# Patient Record
Sex: Female | Born: 1980 | Race: Black or African American | Hispanic: No | Marital: Married | Smoking: Never smoker
Health system: Southern US, Community
[De-identification: ages and names within clinical notes are randomized; demographics above are authoritative.]

## PROBLEM LIST (undated history)

## (undated) DIAGNOSIS — E669 Obesity, unspecified: Secondary | ICD-10-CM

## (undated) DIAGNOSIS — I10 Essential (primary) hypertension: Secondary | ICD-10-CM

## (undated) HISTORY — PX: WISDOM TOOTH EXTRACTION: SHX21

---

## 1998-10-22 ENCOUNTER — Emergency Department (HOSPITAL_COMMUNITY): Admission: EM | Admit: 1998-10-22 | Discharge: 1998-10-22 | Payer: Self-pay | Admitting: Emergency Medicine

## 1998-10-22 ENCOUNTER — Encounter: Payer: Self-pay | Admitting: Emergency Medicine

## 2000-06-01 ENCOUNTER — Emergency Department (HOSPITAL_COMMUNITY): Admission: EM | Admit: 2000-06-01 | Discharge: 2000-06-01 | Payer: Self-pay | Admitting: Emergency Medicine

## 2001-09-11 ENCOUNTER — Emergency Department (HOSPITAL_COMMUNITY): Admission: EM | Admit: 2001-09-11 | Discharge: 2001-09-11 | Payer: Self-pay | Admitting: Emergency Medicine

## 2001-09-11 ENCOUNTER — Encounter: Payer: Self-pay | Admitting: Emergency Medicine

## 2003-07-22 ENCOUNTER — Emergency Department (HOSPITAL_COMMUNITY): Admission: EM | Admit: 2003-07-22 | Discharge: 2003-07-22 | Payer: Self-pay | Admitting: Emergency Medicine

## 2003-07-22 ENCOUNTER — Encounter: Payer: Self-pay | Admitting: Emergency Medicine

## 2004-05-19 ENCOUNTER — Emergency Department (HOSPITAL_COMMUNITY): Admission: EM | Admit: 2004-05-19 | Discharge: 2004-05-19 | Payer: Self-pay | Admitting: Emergency Medicine

## 2005-08-20 ENCOUNTER — Emergency Department (HOSPITAL_COMMUNITY): Admission: EM | Admit: 2005-08-20 | Discharge: 2005-08-20 | Payer: Self-pay | Admitting: Emergency Medicine

## 2006-12-14 ENCOUNTER — Inpatient Hospital Stay (HOSPITAL_COMMUNITY): Admission: AD | Admit: 2006-12-14 | Discharge: 2006-12-16 | Payer: Self-pay | Admitting: Psychiatry

## 2006-12-14 ENCOUNTER — Emergency Department (HOSPITAL_COMMUNITY): Admission: EM | Admit: 2006-12-14 | Discharge: 2006-12-15 | Payer: Self-pay | Admitting: Emergency Medicine

## 2006-12-14 ENCOUNTER — Ambulatory Visit: Payer: Self-pay | Admitting: Psychiatry

## 2009-05-15 ENCOUNTER — Emergency Department (HOSPITAL_COMMUNITY): Admission: EM | Admit: 2009-05-15 | Discharge: 2009-05-15 | Payer: Self-pay | Admitting: Emergency Medicine

## 2011-04-30 NOTE — Discharge Summary (Signed)
Cassandra Mayer, Cassandra Mayer NO.:  [id number]   MEDICAL RECORD NO.:  [id number]          PATIENT TYPE:  IPS   LOCATION:  0306                          FACILITY:  [facility]   PHYSICIAN:  Jasmine Pang, M.D. DATE OF BIRTH:  December 11, 1981   DATE OF ADMISSION:  12/14/2006  DATE OF DISCHARGE:  12/16/2006                               DISCHARGE SUMMARY   IDENTIFYING INFORMATION:  This is a 30 year old married African-American  female who was admitted on an involuntary basis on December 14, 2006.   HISTORY OF PRESENT ILLNESS:  On the petition, the patient stated she had  made self-mutilating cuts with a knife on December 14, 2006.  She had used  a Interior and spatial designer.  She had been angry with her husband and asked him to  leave.  Then, she got scared when she saw that he was packing his  belongings and attempted to get him to stay.  When she could not do  this, she made several superficial cuts on her left wrist.  The husband  then called 9-1-1.  The patient said she has had episodes of feeling  down.  No suicidal ideation.  She had a number of stressors including  problems with her husband and financial.  She has two children, ages 49  and 64.  This is her first [facility] admission.  She has no current outpatient  therapy.  She denies any drug or alcohol use.  She is on no medications.  For further admission information, see psychiatric admission assessment.   PHYSICAL EXAMINATION:  Physical exam was within normal limits.  She was  in no acute distress.   LABORATORY DATA:  CBC was within normal limits.  UDS negative.  Basic  metabolic normal except for an elevated glucose of 126.  Alcohol was  less than 5.  Acetaminophen less than 10.  Urinalysis negative.   HOSPITAL COURSE:  Upon admission, the patient talked about the incident  that occurred with the cutting.  She stated I got upset with my husband  and cut myself on my arm.  She states this was an impulsive gesture.  Initially, she had the  knife to scare her husband that she would hurt  herself.  She stated she accidentally cut herself but there were at  least five superficial cuts on her wrist.  She states that when the  police came she made a scene because she thought he had called them to  have her arrested.  She walked away from the police.  She was crying and  huddled down.  She was then taken to [facility] and committed to [place].  She has had no previous psychiatric  treatment.  No medications.  The patient felt silly for having gotten so  emotional.  She states that she does not want to hurt herself and feels  that she can manage if her husband does decide to leave.  She wants to  look into getting some family therapy as well.  On December 16, 2006, the  patient's mental status had improved markedly.  She  was friendly and  cooperative with the exam.  She had good eye contact.  Speech normal  rate and flow.  Psychomotor activity was within normal limits.  Her mood  was euthymic.  Affect wide range.  No suicidal or homicidal ideation.  No thoughts of self-injurious behavior.  The patient had no auditory or  visual hallucinations.  No paranoia or delusions.  Thoughts were logical  and goal-directed.  Thought content:  The patient felt confident that  she could get along with her husband leaving now and try to work out  things with him on an outpatient basis.  A family session with her  husband was scheduled for this evening.  It was planned that she would  be discharged if this went well.  Cognitive was grossly within normal  limits.  Upon discharge, she was started on Risperdal 0.25 mg at bedtime  to help with her agitation and mood stabilization.   DISCHARGE DIAGNOSES:  AXIS I:  Mood disorder not otherwise specified.  AXIS II:  None.  AXIS III:  No acute or chronic problems.  AXIS IV:  Moderate to severe (problems with primary support group,  economic problem, occupational problems).  AXIS V:   GAF upon discharge 50; GAF upon admission 35; GAF highest past  year 65-70.   ACTIVITY/DIET:  There were no specific activity level or dietary  restrictions.   DISCHARGE MEDICATIONS:  The patient was given a prescription for  Risperdal 0.25 mg at bedtime to help with agitation and mood  stabilization.   POST-HOSPITAL CARE PLANS:  The patient will be seen at the [facility] on Thursday, December 22, 2006 at 2 p.m. by Dr. Lang Snow.      Jasmine Pang, M.D.  Electronically Signed     BHS/MEDQ  D:  12/16/2006  T:  12/16/2006  Job:  [id number]

## 2011-04-30 NOTE — H&P (Signed)
Cassandra Mayer, SHIPPEE NO.:  [id number]   MEDICAL RECORD NO.:  [id number]         PATIENT TYPE:  BIPS   LOCATION:                                FACILITY:  [facility]   PHYSICIAN:  Anselm Jungling, MD  DATE OF BIRTH:  10-Feb-1981   DATE OF ADMISSION:  12/14/2006  DATE OF DISCHARGE:  12/16/2006                       PSYCHIATRIC ADMISSION ASSESSMENT   This 30 year old married African female involuntary committed on  12/14/2006.   HISTORY OF PRESENT ILLNESS:  The patient is here on petition papers.  It  states that the patient has been self-mutilating.  She cut her wrist  with a knife.  On 12/14/2006, the patient states that she did have self-  inflicted injuries using a kitchen knife.  She states it was a very  impulsive act.  The patient states she was arguing with her husband.  She had asked him to leave.  She then got very concerned when her  husband actually started to pack of his belongings.  The patient states  her cutting was a gesture to make him stay and to talk to her.  The  patient then called 9-1-1.  The patient was transported to the emergency  department for assessment of her injury.  The patient does report having  episodes of feeling very down but denies any suicidal thoughts.  She  states that she is able to enjoy things when she wants to.  She has been  sleeping well, her appetite has been satisfactory.  The patient states  one of her stressors is that her and her husband have problems  communicating with each other.   PAST PSYCHIATRIC HISTORY:  [facility]  No current  outpatient therapy.  No other psychiatric admissions.   SOCIAL HISTORY:  This is a 30 year old married African female.  They  have been married for two years.  She has two children, age 25 and 15.  Her husband is currently with the children.  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  She has a college degree.   FAMILY HISTORY:  She thinks her mother  possibly has some depressive  symptoms.   She is a nonsmoker.  Alcohol on rare occasions.  No drug use.   PRIMARY CARE Dinisha Cai:  No current Azarel Banner.   MEDICAL PROBLEMS:  No current medical problems.   MEDICATIONS:  No medications.   DRUG ALLERGIES:  No known allergies.   The patient was assessed at [facility] emergency department.  This is  an overweight female in no acute physical distress.  The patient has a  Kerlix present to her left wrist.  There is no bleeding noted.  Her  laceration apparently was Steri-Stripped.  Her temperature is 98.4, 89  heart rate, 20 respirations, blood pressure 152/92, 252 pounds, 5 feet 3-  1/2 inches tall, 100% saturated.  Her CBC is within normal limits.  Urine and drug screen is negative.  Glucose is 126.  Alcohol level was  less than five.  Acetaminophen level was less than 10.  Urinalysis was  negative.   MENTAL STATUS  EXAM:  She is fully alert, cooperative, good eye contact.  Speech is clear, normal rate and tone.  The patient feels depressed.  Affect:  She is cooperative, smiles, agreeable to plan.  Thought content  is coherent.  There is no evidence of psychosis. __________  intact.  Memory is good.  Judgment is fair.  Insight is limited.   ASSESSMENT:  AXIS I:  Major depressive disorder, single, severe.  AXIS II:  Deferred.  AXIS III:  No acute or chronic health issues.  AXIS IV:  Problems with primary support group.  AXIS V:  Current is 35-40.   PLAN:  The patient is to increase coping skills and attend individual  group therapy.  Will have a family session with husband for support and  address any concerns.  The patient at this time is reluctant to take any  medication.  Risks and benefits of antidepressants was discussed with  the patient.  __________  4-5 days.      Landry Corporal, N.P.      Anselm Jungling, MD  Electronically Signed    JO/MEDQ  D:  12/15/2006  T:  12/15/2006  Job:  [id number]

## 2013-12-30 ENCOUNTER — Encounter (HOSPITAL_COMMUNITY): Payer: Self-pay | Admitting: Emergency Medicine

## 2013-12-30 ENCOUNTER — Emergency Department (HOSPITAL_COMMUNITY)
Admission: EM | Admit: 2013-12-30 | Discharge: 2013-12-30 | Disposition: A | Discharge status: Home / Self Care | Payer: 59 | Attending: Emergency Medicine | Admitting: Emergency Medicine

## 2013-12-30 DIAGNOSIS — R519 Headache, unspecified: Secondary | ICD-10-CM

## 2013-12-30 DIAGNOSIS — M542 Cervicalgia: Secondary | ICD-10-CM | POA: Insufficient documentation

## 2013-12-30 DIAGNOSIS — E669 Obesity, unspecified: Secondary | ICD-10-CM | POA: Insufficient documentation

## 2013-12-30 DIAGNOSIS — I10 Essential (primary) hypertension: Secondary | ICD-10-CM | POA: Insufficient documentation

## 2013-12-30 DIAGNOSIS — R51 Headache: Secondary | ICD-10-CM | POA: Insufficient documentation

## 2013-12-30 DIAGNOSIS — R110 Nausea: Secondary | ICD-10-CM | POA: Insufficient documentation

## 2013-12-30 DIAGNOSIS — H53149 Visual discomfort, unspecified: Secondary | ICD-10-CM | POA: Insufficient documentation

## 2013-12-30 HISTORY — DX: Essential (primary) hypertension: I10

## 2013-12-30 MED ORDER — PROCHLORPERAZINE MALEATE 10 MG PO TABS: 10.0000 mg | ORAL_TABLET | Freq: Two times a day (BID) | ORAL | Status: DC | PRN

## 2013-12-30 MED ORDER — DEXAMETHASONE SODIUM PHOSPHATE 10 MG/ML IJ SOLN
10.0000 mg | Freq: Once | INTRAMUSCULAR | Status: AC
  Administered 2013-12-30: 10 mg via INTRAVENOUS
  Filled 2013-12-30: qty 1

## 2013-12-30 MED ORDER — DIPHENHYDRAMINE HCL 50 MG/ML IJ SOLN
25.0000 mg | Freq: Once | INTRAMUSCULAR | Status: AC
  Administered 2013-12-30: 25 mg via INTRAVENOUS
  Filled 2013-12-30: qty 1

## 2013-12-30 MED ORDER — PROCHLORPERAZINE EDISYLATE 5 MG/ML IJ SOLN
10.0000 mg | Freq: Once | INTRAMUSCULAR | Status: AC
  Administered 2013-12-30: 10 mg via INTRAVENOUS
  Filled 2013-12-30: qty 2

## 2013-12-30 MED ORDER — SODIUM CHLORIDE 0.9 % IV BOLUS (SEPSIS)
1000.0000 mL | Freq: Once | INTRAVENOUS | Status: AC
  Administered 2013-12-30: 1000 mL via INTRAVENOUS

## 2013-12-30 NOTE — ED Notes (Signed)
Patient is alert and orietned x3.  She is complaining of a headache that started last night. Currently she rates her pain 9 of 10 with movement and nausea.  Patient describes her headache As bilateral across her sinus area and pain in her neck.

## 2013-12-30 NOTE — Discharge Instructions (Signed)
Migraine Headache A migraine headache is an intense, throbbing pain on one or both sides of your head. A migraine can last for 30 minutes to several hours. CAUSES  The exact cause of a migraine headache is not always known. However, a migraine may be caused when nerves in the brain become irritated and release chemicals that cause inflammation. This causes pain. Certain things may also trigger migraines, such as:  Alcohol.  Smoking.  Stress.  Menstruation.  Aged cheeses.  Foods or drinks that contain nitrates, glutamate, aspartame, or tyramine.  Lack of sleep.  Chocolate.  Caffeine.  Hunger.  Physical exertion.  Fatigue.  Medicines used to treat chest pain (nitroglycerine), birth control pills, estrogen, and some blood pressure medicines. SIGNS AND SYMPTOMS  Pain on one or both sides of your head.  Pulsating or throbbing pain.  Severe pain that prevents daily activities.  Pain that is aggravated by any physical activity.  Nausea, vomiting, or both.  Dizziness.  Pain with exposure to bright lights, loud noises, or activity.  General sensitivity to bright lights, loud noises, or smells. Before you get a migraine, you may get warning signs that a migraine is coming (aura). An aura may include:  Seeing flashing lights.  Seeing bright spots, halos, or zig-zag lines.  Having tunnel vision or blurred vision.  Having feelings of numbness or tingling.  Having trouble talking.  Having muscle weakness. DIAGNOSIS  A migraine headache is often diagnosed based on:  Symptoms.  Physical exam.  A CT scan or MRI of your head. These imaging tests cannot diagnose migraines, but they can help rule out other causes of headaches. TREATMENT Medicines may be given for pain and nausea. Medicines can also be given to help prevent recurrent migraines.  HOME CARE INSTRUCTIONS  Only take over-the-counter or prescription medicines for pain or discomfort as directed by your  health care provider. The use of long-term narcotics is not recommended.  Lie down in a dark, quiet room when you have a migraine.  Keep a journal to find out what may trigger your migraine headaches. For example, write down:  What you eat and drink.  How much sleep you get.  Any change to your diet or medicines.  Limit alcohol consumption.  Quit smoking if you smoke.  Get 7 9 hours of sleep, or as recommended by your health care provider.  Limit stress.  Keep lights dim if bright lights bother you and make your migraines worse. SEEK IMMEDIATE MEDICAL CARE IF:   Your migraine becomes severe.  You have a fever.  You have a stiff neck.  You have vision loss.  You have muscular weakness or loss of muscle control.  You start losing your balance or have trouble walking.  You feel faint or pass out.  You have severe symptoms that are different from your first symptoms. MAKE SURE YOU:   Understand these instructions.  Will watch your condition.  Will get help right away if you are not doing well or get worse. Document Released: 11/29/2005 Document Revised: 09/19/2013 Document Reviewed: 08/06/2013 [facility] The[facility]Care Patient Information 2014 [employer], [place].  Headaches, Frequently Asked Questions MIGRAINE HEADACHES Q: What is migraine? What causes it? How can I treat it? A: Generally, migraine headaches begin as a dull ache. Then they develop into a constant, throbbing, and pulsating pain. You may experience pain at the temples. You may experience pain at the front or back of one or both sides of the head. The pain is usually accompanied by  a combination of:  Nausea.  Vomiting.  Sensitivity to light and noise. Some people (about 15%) experience an aura (see below) before an attack. The cause of migraine is believed to be chemical reactions in the brain. Treatment for migraine may include over-the-counter or prescription medications. It may also include self-help techniques.  These include relaxation training and biofeedback.  Q: What is an aura? A: About 15% of people with migraine get an "aura". This is a sign of neurological symptoms that occur before a migraine headache. You may see wavy or jagged lines, dots, or flashing lights. You might experience tunnel vision or blind spots in one or both eyes. The aura can include visual or auditory hallucinations (something imagined). It may include disruptions in smell (such as strange odors), taste or touch. Other symptoms include:  Numbness.  A "pins and needles" sensation.  Difficulty in recalling or speaking the correct word. These neurological events may last as long as 60 minutes. These symptoms will fade as the headache begins. Q: What is a trigger? A: Certain physical or environmental factors can lead to or "trigger" a migraine. These include:  Foods.  Hormonal changes.  Weather.  Stress. It is important to remember that triggers are different for everyone. To help prevent migraine attacks, you need to figure out which triggers affect you. Keep a headache diary. This is a good way to track triggers. The diary will help you talk to your healthcare professional about your condition. Q: Does weather affect migraines? A: Bright sunshine, hot, humid conditions, and drastic changes in barometric pressure may lead to, or "trigger," a migraine attack in some people. But studies have shown that weather does not act as a trigger for everyone with migraines. Q: What is the link between migraine and hormones? A: Hormones start and regulate many of your body's functions. Hormones keep your body in balance within a constantly changing environment. The levels of hormones in your body are unbalanced at times. Examples are during menstruation, pregnancy, or menopause. That can lead to a migraine attack. In fact, about three quarters of all women with migraine report that their attacks are related to the menstrual cycle.  Q: Is  there an increased risk of stroke for migraine sufferers? A: The likelihood of a migraine attack causing a stroke is very remote. That is not to say that migraine sufferers cannot have a stroke associated with their migraines. In persons under age 35, the most common associated factor for stroke is migraine headache. But over the course of a person's normal life span, the occurrence of migraine headache may actually be associated with a reduced risk of dying from cerebrovascular disease due to stroke.  Q: What are acute medications for migraine? A: Acute medications are used to treat the pain of the headache after it has started. Examples over-the-counter medications, NSAIDs, ergots, and triptans.  Q: What are the triptans? A: Triptans are the newest class of abortive medications. They are specifically targeted to treat migraine. Triptans are vasoconstrictors. They moderate some chemical reactions in the brain. The triptans work on receptors in your brain. Triptans help to restore the balance of a neurotransmitter called serotonin. Fluctuations in levels of serotonin are thought to be a main cause of migraine.  Q: Are over-the-counter medications for migraine effective? A: Over-the-counter, or "OTC," medications may be effective in relieving mild to moderate pain and associated symptoms of migraine. But you should see your caregiver before beginning any treatment regimen for migraine.  Q: What  are preventive medications for migraine? A: Preventive medications for migraine are sometimes referred to as "prophylactic" treatments. They are used to reduce the frequency, severity, and length of migraine attacks. Examples of preventive medications include antiepileptic medications, antidepressants, beta-blockers, calcium channel blockers, and NSAIDs (nonsteroidal anti-inflammatory drugs). Q: Why are anticonvulsants used to treat migraine? A: During the past few years, there has been an increased interest in  antiepileptic drugs for the prevention of migraine. They are sometimes referred to as "anticonvulsants". Both epilepsy and migraine may be caused by similar reactions in the brain.  Q: Why are antidepressants used to treat migraine? A: Antidepressants are typically used to treat people with depression. They may reduce migraine frequency by regulating chemical levels, such as serotonin, in the brain.  Q: What alternative therapies are used to treat migraine? A: The term "alternative therapies" is often used to describe treatments considered outside the scope of conventional Western medicine. Examples of alternative therapy include acupuncture, acupressure, and yoga. Another common alternative treatment is herbal therapy. Some herbs are believed to relieve headache pain. Always discuss alternative therapies with your caregiver before proceeding. Some herbal products contain arsenic and other toxins. TENSION HEADACHES Q: What is a tension-type headache? What causes it? How can I treat it? A: Tension-type headaches occur randomly. They are often the result of temporary stress, anxiety, fatigue, or anger. Symptoms include soreness in your temples, a tightening band-like sensation around your head (a "vice-like" ache). Symptoms can also include a pulling feeling, pressure sensations, and contracting head and neck muscles. The headache begins in your forehead, temples, or the back of your head and neck. Treatment for tension-type headache may include over-the-counter or prescription medications. Treatment may also include self-help techniques such as relaxation training and biofeedback. CLUSTER HEADACHES Q: What is a cluster headache? What causes it? How can I treat it? A: Cluster headache gets its name because the attacks come in groups. The pain arrives with little, if any, warning. It is usually on one side of the head. A tearing or bloodshot eye and a runny nose on the same side of the headache may also  accompany the pain. Cluster headaches are believed to be caused by chemical reactions in the brain. They have been described as the most severe and intense of any headache type. Treatment for cluster headache includes prescription medication and oxygen. SINUS HEADACHES Q: What is a sinus headache? What causes it? How can I treat it? A: When a cavity in the bones of the face and skull (a sinus) becomes inflamed, the inflammation will cause localized pain. This condition is usually the result of an allergic reaction, a tumor, or an infection. If your headache is caused by a sinus blockage, such as an infection, you will probably have a fever. An x-ray will confirm a sinus blockage. Your caregiver's treatment might include antibiotics for the infection, as well as antihistamines or decongestants.  REBOUND HEADACHES Q: What is a rebound headache? What causes it? How can I treat it? A: A pattern of taking acute headache medications too often can lead to a condition known as "rebound headache." A pattern of taking too much headache medication includes taking it more than 2 days per week or in excessive amounts. That means more than the label or a caregiver advises. With rebound headaches, your medications not only stop relieving pain, they actually begin to cause headaches. Doctors treat rebound headache by tapering the medication that is being overused. Sometimes your caregiver will gradually substitute a  different type of treatment or medication. Stopping may be a challenge. Regularly overusing a medication increases the potential for serious side effects. Consult a caregiver if you regularly use headache medications more than 2 days per week or more than the label advises. ADDITIONAL QUESTIONS AND ANSWERS Q: What is biofeedback? A: Biofeedback is a self-help treatment. Biofeedback uses special equipment to monitor your body's involuntary physical responses. Biofeedback  monitors:  Breathing.  Pulse.  Heart rate.  Temperature.  Muscle tension.  Brain activity. Biofeedback helps you refine and perfect your relaxation exercises. You learn to control the physical responses that are related to stress. Once the technique has been mastered, you do not need the equipment any more. Q: Are headaches hereditary? A: Four out of five (80%) of people that suffer report a family history of migraine. Scientists are not sure if this is genetic or a family predisposition. Despite the uncertainty, a child has a 50% chance of having migraine if one parent suffers. The child has a 75% chance if both parents suffer.  Q: Can children get headaches? A: By the time they reach high school, most young people have experienced some type of headache. Many safe and effective approaches or medications can prevent a headache from occurring or stop it after it has begun.  Q: What type of doctor should I see to diagnose and treat my headache? A: Start with your primary caregiver. Discuss his or her experience and approach to headaches. Discuss methods of classification, diagnosis, and treatment. Your caregiver may decide to recommend you to a headache specialist, depending upon your symptoms or other physical conditions. Having diabetes, allergies, etc., may require a more comprehensive and inclusive approach to your headache. The National Headache Foundation will provide, upon request, a list of [facility] Center[facility] physician members in your state. Document Released: 02/19/2004 Document Revised: 02/21/2012 Document Reviewed: 07/29/2008 [facility] Patient Information 2014 [place]ExitCare, [place]LLC.

## 2013-12-30 NOTE — ED Provider Notes (Signed)
CSN: [id number]     Arrival date & time 12/30/13  2044 History   First MD Initiated Contact with Patient 12/30/13 2159     Chief Complaint  Patient presents with  . Headache   (Consider location/radiation/quality/duration/timing/severity/associated sxs/prior Treatment) HPI Comments: Patient is a 33 year old female with history of hypertension who presents today with gradually worsening migraine. She reports that she has a history of migraines, but has not had a severe migraine since she was a teenager. This feels like a migraine she got when she was a teenager, but is less severe. The headache began last night and reached its peak this afternoon. It is a throbbing frontal headache. It radiates around to the back of her head. She has associated the pain. The pain is worse with movement. Earlier today she had some photophobia, but this has resolved. She denies any blurry vision or double vision. She has nausea without vomiting.  The history is provided by the patient. No language interpreter was used.    Past Medical History  Diagnosis Date  . Hypertension    History reviewed. No pertinent past surgical history. No family history on file. History  Substance Use Topics  . Smoking status: Never Smoker   . Smokeless tobacco: Not on file  . Alcohol Use: Yes   OB History   Grav Para Term Preterm Abortions TAB SAB Ect Mult Living                 Review of Systems  Constitutional: Negative for fever, chills and diaphoresis.  Eyes: Positive for photophobia. Negative for visual disturbance.  Respiratory: Negative for shortness of breath.   Cardiovascular: Negative for chest pain.  Gastrointestinal: Positive for nausea. Negative for vomiting and abdominal pain.  Musculoskeletal: Positive for myalgias and neck pain. Negative for neck stiffness.  Neurological: Positive for headaches.  All other systems reviewed and are negative.    Allergies  Review of patient's allergies indicates no  known allergies.  Home Medications   Current Outpatient Rx  Name  Route  Sig  Dispense  Refill  . ibuprofen (ADVIL,MOTRIN) 200 MG tablet   Oral   Take 400 mg by mouth every 6 (six) hours as needed (pain).         . Nutritional Supplements (COLD AND FLU PO)   Oral   Take 10 mLs by mouth every 6 (six) hours as needed (cold symptoms).          BP 147/80  Pulse 101  Temp(Src) 98.8 F (37.1 C) (Oral)  Resp 18  SpO2 97%  LMP 12/21/2013 Physical Exam  Nursing note and vitals reviewed. Constitutional: She is oriented to person, place, and time. She appears well-developed and well-nourished. She does not appear ill. No distress.  obese  HENT:  Head: Normocephalic and atraumatic.  Right Ear: Tympanic membrane, external ear and ear canal normal.  Left Ear: Tympanic membrane, external ear and ear canal normal.  Nose: Nose normal. Right sinus exhibits no maxillary sinus tenderness and no frontal sinus tenderness. Left sinus exhibits no maxillary sinus tenderness and no frontal sinus tenderness.  Mouth/Throat: Uvula is midline, oropharynx is clear and moist and mucous membranes are normal.  No temporal artery tenderness  Eyes: Conjunctivae and EOM are normal. Pupils are equal, round, and reactive to light.  Neck: Normal range of motion.    No nuchal rigidity or meningeal signs. Moves neck freely around.   Cardiovascular: Normal rate, regular rhythm, normal heart sounds, intact distal pulses and normal  pulses.   Pulmonary/Chest: Effort normal and breath sounds normal. No stridor. No respiratory distress. She has no wheezes. She has no rales.  Abdominal: Soft. She exhibits no distension. There is no tenderness.  Musculoskeletal: Normal range of motion.  Lymphadenopathy:    She has no cervical adenopathy.  Neurological: She is alert and oriented to person, place, and time. She has normal strength. No sensory deficit. Coordination and gait normal.  Grip strength 5/5 bilaterally. No  pronator drift. Finger nose finger normal. Heel-knee-shin normal.   Skin: Skin is warm and dry. She is not diaphoretic. No erythema.  Psychiatric: She has a normal mood and affect. Her behavior is normal.    ED Course  Procedures (including critical care time) Labs Review Labs Reviewed - No data to display Imaging Review No results found.  EKG Interpretation   None       MDM   1. Headache    Pt HA treated and improved while in ED.  Presentation is like pts typical HA and non concerning for [place] [facility], ICH, Meningitis, or temporal arteritis. Pt is afebrile with no focal neuro deficits, nuchal rigidity, or change in vision. Pt is to follow up with PCP to discuss prophylactic medication. Pt verbalizes understanding and is agreeable with plan to dc.       Mora BellmanHannah S Thurlow Gallaga, PA-C 12/31/13 [phone]

## 2013-12-31 NOTE — ED Provider Notes (Signed)
Medical screening examination/treatment/procedure(s) were performed by non-physician practitioner and as supervising physician I was immediately available for consultation/collaboration.  EKG Interpretation   None         Audree CamelScott T Colie Fugitt, MD 12/31/13 1057

## 2014-08-07 ENCOUNTER — Emergency Department (HOSPITAL_COMMUNITY)
Admission: EM | Admit: 2014-08-07 | Discharge: 2014-08-07 | Disposition: A | Discharge status: Home / Self Care | Payer: 59 | Attending: Emergency Medicine | Admitting: Emergency Medicine

## 2014-08-07 ENCOUNTER — Encounter (HOSPITAL_COMMUNITY): Payer: Self-pay | Admitting: Emergency Medicine

## 2014-08-07 DIAGNOSIS — Z3202 Encounter for pregnancy test, result negative: Secondary | ICD-10-CM | POA: Insufficient documentation

## 2014-08-07 DIAGNOSIS — I10 Essential (primary) hypertension: Secondary | ICD-10-CM | POA: Insufficient documentation

## 2014-08-07 DIAGNOSIS — R51 Headache: Secondary | ICD-10-CM | POA: Diagnosis not present

## 2014-08-07 DIAGNOSIS — R42 Dizziness and giddiness: Secondary | ICD-10-CM | POA: Diagnosis present

## 2014-08-07 LAB — URINALYSIS, ROUTINE W REFLEX MICROSCOPIC
Bilirubin Urine: NEGATIVE
GLUCOSE, UA: NEGATIVE mg/dL
Hgb urine dipstick: NEGATIVE
KETONES UR: NEGATIVE mg/dL
NITRITE: NEGATIVE
Protein, ur: NEGATIVE mg/dL
Specific Gravity, Urine: 1.022 (ref 1.005–1.030)
Urobilinogen, UA: 1 mg/dL (ref 0.0–1.0)
pH: 7 (ref 5.0–8.0)

## 2014-08-07 LAB — BASIC METABOLIC PANEL
ANION GAP: 11 (ref 5–15)
BUN: 11 mg/dL (ref 6–23)
CALCIUM: 9.5 mg/dL (ref 8.4–10.5)
CO2: 25 mEq/L (ref 19–32)
CREATININE: 0.8 mg/dL (ref 0.50–1.10)
Chloride: 102 mEq/L (ref 96–112)
GFR calc Af Amer: >90 mL/min (ref >90)
Glucose, Bld: 110 mg/dL — ABNORMAL HIGH (ref 70–99)
Potassium: 4.4 mEq/L (ref 3.7–5.3)
Sodium: 138 mEq/L (ref 137–147)

## 2014-08-07 LAB — CBC
HEMATOCRIT: 38 % (ref 36.0–46.0)
Hemoglobin: 12.2 g/dL (ref 12.0–15.0)
MCH: 26.8 pg (ref 26.0–34.0)
MCHC: 32.1 g/dL (ref 30.0–36.0)
MCV: 83.5 fL (ref 78.0–100.0)
Platelets: 364 10*3/uL (ref 150–400)
RBC: 4.55 MIL/uL (ref 3.87–5.11)
RDW: 13.8 % (ref 11.5–15.5)
WBC: 7.3 10*3/uL (ref 4.0–10.5)

## 2014-08-07 LAB — POC URINE PREG, ED: Preg Test, Ur: NEGATIVE

## 2014-08-07 LAB — URINE MICROSCOPIC-ADD ON

## 2014-08-07 MED ORDER — IRBESARTAN 300 MG PO TABS
300.0000 mg | ORAL_TABLET | Freq: Every day | ORAL | Status: DC
  Administered 2014-08-07: 300 mg via ORAL
  Filled 2014-08-07: qty 1

## 2014-08-07 MED ORDER — AMLODIPINE BESYLATE 10 MG PO TABS: 10.0000 mg | ORAL_TABLET | Freq: Every day | ORAL | Status: DC

## 2014-08-07 MED ORDER — IBUPROFEN 800 MG PO TABS
800.0000 mg | ORAL_TABLET | Freq: Once | ORAL | Status: AC
  Administered 2014-08-07: 800 mg via ORAL
  Filled 2014-08-07: qty 1

## 2014-08-07 MED ORDER — VALSARTAN 320 MG PO TABS: 320.0000 mg | ORAL_TABLET | Freq: Every day | ORAL | Status: DC

## 2014-08-07 MED ORDER — AMLODIPINE BESYLATE 10 MG PO TABS
10.0000 mg | ORAL_TABLET | Freq: Once | ORAL | Status: AC
  Administered 2014-08-07: 10 mg via ORAL
  Filled 2014-08-07: qty 1

## 2014-08-07 NOTE — ED Notes (Signed)
Pt w/ hx of htn.  Has not been taking her htn meds for 1 month d/t cost.  Pt states that she was at work around 1 pm today and felt like her BP was high.  States that she feels a little bit better now but states that dizziness comes and goes.

## 2014-08-07 NOTE — ED Notes (Signed)
Initial Contact - pt A+Ox4, reports "BP is high", c/o intermittent dizziness, better now.  Neuros grossly intact.  Skin PWD.  MAEI, ambulatory with steady gait.  Speaking full/clear sentences.  NAD.

## 2014-08-07 NOTE — ED Provider Notes (Signed)
TIME SEEN: 8:15 PM  CHIEF COMPLAINT: Hypertension, dizziness, headache  HPI: Patient is a 33 year old female with history of hypertension on Diovan and Norvasc who has stopped taking her medication for the past month secondary to cost who presents emergency department with lightheadedness and vertigo that started today and his been intermittent as well as a very mild diffuse headache. She denies any numbness, tingling or focal weakness. No chest pain or shortness of breath. No blurry vision or vision changes. States she is very feeling somewhat better since arriving at the ED. Denies any recent fever, cough, vomiting or diarrhea. No tinnitus, hearing loss or ear pain.  ROS: See HPI Constitutional: no fever  Eyes: no drainage  ENT: no runny nose   Cardiovascular:  no chest pain  Resp: no SOB  GI: no vomiting GU: no dysuria Integumentary: no rash  Allergy: no hives  Musculoskeletal: no leg swelling  Neurological: no slurred speech ROS otherwise negative  PAST MEDICAL HISTORY/PAST SURGICAL HISTORY:  Past Medical History  Diagnosis Date  . Hypertension     MEDICATIONS:  Prior to Admission medications   Medication Sig Start Date End Date Taking? Authorizing Provider  ibuprofen (ADVIL,MOTRIN) 200 MG tablet Take 400 mg by mouth every 6 (six) hours as needed (pain).    Historical Provider, MD  Nutritional Supplements (COLD AND FLU PO) Take 10 mLs by mouth every 6 (six) hours as needed (cold symptoms).    Historical Provider, MD  prochlorperazine (COMPAZINE) 10 MG tablet Take 1 tablet (10 mg total) by mouth 2 (two) times daily as needed for nausea or vomiting (Nausea ). 12/30/13   Mora Bellman, PA-C    ALLERGIES:  No Known Allergies  SOCIAL HISTORY:  History  Substance Use Topics  . Smoking status: Never Smoker   . Smokeless tobacco: Not on file  . Alcohol Use: Yes    FAMILY HISTORY: No family history on file.  EXAM: BP 182/95  Pulse 87  Temp(Src) 98.6 F (37 C) (Oral)   Resp 19  SpO2 100% CONSTITUTIONAL: Alert and oriented and responds appropriately to questions. Well-appearing; well-nourished HEAD: Normocephalic EYES: Conjunctivae clear, PERRL ENT: normal nose; no rhinorrhea; moist mucous membranes; pharynx without lesions noted NECK: Supple, no meningismus, no LAD  CARD: RRR; S1 and S2 appreciated; no murmurs, no clicks, no rubs, no gallops RESP: Normal chest excursion without splinting or tachypnea; breath sounds clear and equal bilaterally; no wheezes, no rhonchi, no rales,  ABD/GI: Normal bowel sounds; non-distended; soft, non-tender, no rebound, no guarding BACK:  The back appears normal and is non-tender to palpation, there is no CVA tenderness EXT: Normal ROM in all joints; non-tender to palpation; no edema; normal capillary refill; no cyanosis    SKIN: Normal color for age and race; warm NEURO: Moves all extremities equally, sensation to light touch intact diffusely, cranial nerves II through XII intact, strength 5/5 in all 4 extremities, no pronator drift, no dysmetria to finger-nose testing, normal gait PSYCH: The patient's mood and manner are appropriate. Grooming and personal hygiene are appropriate.  MEDICAL DECISION MAKING: Patient here with very vague symptoms of lightheadedness and mild headache that are improving. She is hypertensive 182/95. We'll check basic labs, urine to evaluate for signs of endorgan damage or possible organic causes for her lightheadedness is due to anemia, low thyroid modality or UTI, pregnancy. We'll give her her blood pressure medication and monitor her blood pressure closely. Anticipate if blood pressure is controlled and her workup is unremarkable and she will  be discharged home.  ED PROGRESS: Patient's labs are unremarkable. Urine shows leukocytes but rare bacteria and a few squamous cells. Suspect dirty catch. Pregnancy test negative. Her blood pressure has improved to 135/55. She states she is feeling better. I feel  she is safe to be discharged home. Have refilled her amlodipine and losartan. Have provided her with coupons for both medications CT obtained at [facility] for less than $10 each. Discussed return precautions and supportive care instructions. She verbalized understanding and is comfortable with plan.       Layla Maw Ward, DO 08/07/14 2353

## 2014-08-07 NOTE — Discharge Instructions (Signed)
Dizziness °Dizziness is a common problem. It is a feeling of unsteadiness or light-headedness. You may feel like you are about to faint. Dizziness can lead to injury if you stumble or fall. A person of any age group can suffer from dizziness, but dizziness is more common in older adults. °CAUSES  °Dizziness can be caused by many different things, including: °· Middle ear problems. °· Standing for too long. °· Infections. °· An allergic reaction. °· Aging. °· An emotional response to something, such as the sight of blood. °· Side effects of medicines. °· Tiredness. °· Problems with circulation or blood pressure. °· Excessive use of alcohol or medicines, or illegal drug use. °· Breathing too fast (hyperventilation). °· An irregular heart rhythm (arrhythmia). °· A low red blood cell count (anemia). °· Pregnancy. °· Vomiting, diarrhea, fever, or other illnesses that cause body fluid loss (dehydration). °· Diseases or conditions such as Parkinson's disease, high blood pressure (hypertension), diabetes, and thyroid problems. °· Exposure to extreme heat. °DIAGNOSIS  °Your health care provider will ask about your symptoms, perform a physical exam, and perform an electrocardiogram (ECG) to record the electrical activity of your heart. Your health care provider may also perform other heart or blood tests to determine the cause of your dizziness. These may include: °· Transthoracic echocardiogram (TTE). During echocardiography, sound waves are used to evaluate how blood flows through your heart. °· Transesophageal echocardiogram (TEE). °· Cardiac monitoring. This allows your health care provider to monitor your heart rate and rhythm in real time. °· Holter monitor. This is a portable device that records your heartbeat and can help diagnose heart arrhythmias. It allows your health care provider to track your heart activity for several days if needed. °· Stress tests by exercise or by giving medicine that makes the heart beat  faster. °TREATMENT  °Treatment of dizziness depends on the cause of your symptoms and can vary greatly. °HOME CARE INSTRUCTIONS  °· Drink enough fluids to keep your urine clear or pale yellow. This is especially important in very hot weather. In older adults, it is also important in cold weather. °· Take your medicine exactly as directed if your dizziness is caused by medicines. When taking blood pressure medicines, it is especially important to get up slowly. °¨ Rise slowly from chairs and steady yourself until you feel okay. °¨ In the morning, first sit up on the side of the bed. When you feel okay, stand slowly while holding onto something until you know your balance is fine. °· Move your legs often if you need to stand in one place for a long time. Tighten and relax your muscles in your legs while standing. °· Have someone stay with you for 1-2 days if dizziness continues to be a problem. Do this until you feel you are well enough to stay alone. Have the person call your health care provider if he or she notices changes in you that are concerning. °· Do not drive or use heavy machinery if you feel dizzy. °· Do not drink alcohol. °SEEK IMMEDIATE MEDICAL CARE IF:  °· Your dizziness or light-headedness gets worse. °· You feel nauseous or vomit. °· You have problems talking, walking, or using your arms, hands, or legs. °· You feel weak. °· You are not thinking clearly or you have trouble forming sentences. It may take a friend or family member to notice this. °· You have chest pain, abdominal pain, shortness of breath, or sweating. °· Your vision changes. °· You notice   any bleeding. °· You have side effects from medicine that seems to be getting worse rather than better. °MAKE SURE YOU:  °· Understand these instructions. °· Will watch your condition. °· Will get help right away if you are not doing well or get worse. °Document Released: 05/25/2001 Document Revised: 12/04/2013 Document Reviewed: 06/18/2011 °ExitCare®  Patient Information ©2015 [employer]. This information is not intended to replace advice given to you by your health care provider. Make sure you discuss any questions you have with your health care provider. ° °Hypertension °Hypertension, commonly called high blood pressure, is when the force of blood pumping through your arteries is too strong. Your arteries are the blood vessels that carry blood from your heart throughout your body. A blood pressure reading consists of a higher number over a lower number, such as 110/72. The higher number (systolic) is the pressure inside your arteries when your heart pumps. The lower number (diastolic) is the pressure inside your arteries when your heart relaxes. Ideally you want your blood pressure below 120/80. °Hypertension forces your heart to work harder to pump blood. Your arteries may become narrow or stiff. Having hypertension puts you at risk for heart disease, stroke, and other problems.  °RISK FACTORS °Some risk factors for high blood pressure are controllable. Others are not.  °Risk factors you cannot control include:  °· Race. You may be at higher risk if you are African American. °· Age. Risk increases with age. °· Gender. Men are at higher risk than women before age 45 years. After age 65, women are at higher risk than men. °Risk factors you can control include: °· Not getting enough exercise or physical activity. °· Being overweight. °· Getting too much fat, sugar, calories, or salt in your diet. °· Drinking too much alcohol. °SIGNS AND SYMPTOMS °Hypertension does not usually cause signs or symptoms. Extremely high blood pressure (hypertensive crisis) may cause headache, anxiety, shortness of breath, and nosebleed. °DIAGNOSIS  °To check if you have hypertension, your health care provider will measure your blood pressure while you are seated, with your arm held at the level of your heart. It should be measured at least twice using the same arm. Certain  conditions can cause a difference in blood pressure between your right and left arms. A blood pressure reading that is higher than normal on one occasion does not mean that you need treatment. If one blood pressure reading is high, ask your health care provider about having it checked again. °TREATMENT  °Treating high blood pressure includes making lifestyle changes and possibly taking medicine. Living a healthy lifestyle can help lower high blood pressure. You may need to change some of your habits. °Lifestyle changes may include: °· Following the DASH diet. This diet is high in fruits, vegetables, and whole grains. It is low in salt, red meat, and added sugars. °· Getting at least 2½ hours of brisk physical activity every week. °· Losing weight if necessary. °· Not smoking. °· Limiting alcoholic beverages. °· Learning ways to reduce stress. ° If lifestyle changes are not enough to get your blood pressure under control, your health care provider may prescribe medicine. You may need to take more than one. Work closely with your health care provider to understand the risks and benefits. °HOME CARE INSTRUCTIONS °· Have your blood pressure rechecked as directed by your health care provider.   °· Take medicines only as directed by your health care provider. Follow the directions carefully. Blood pressure medicines must be taken as   prescribed. The medicine does not work as well when you skip doses. Skipping doses also puts you at risk for problems.   °· Do not smoke.   °· Monitor your blood pressure at home as directed by your health care provider.  °SEEK MEDICAL CARE IF:  °· You think you are having a reaction to medicines taken. °· You have recurrent headaches or feel dizzy. °· You have swelling in your ankles. °· You have trouble with your vision. °SEEK IMMEDIATE MEDICAL CARE IF: °· You develop a severe headache or confusion. °· You have unusual weakness, numbness, or feel faint. °· You have severe chest or abdominal  pain. °· You vomit repeatedly. °· You have trouble breathing. °MAKE SURE YOU:  °· Understand these instructions. °· Will watch your condition. °· Will get help right away if you are not doing well or get worse. °Document Released: 11/29/2005 Document Revised: 04/15/2014 Document Reviewed: 09/21/2013 °ExitCare® Patient Information ©2015 [employer]. This information is not intended to replace advice given to you by your health care provider. Make sure you discuss any questions you have with your health care provider. ° °

## 2014-08-08 LAB — CBG MONITORING, ED: Glucose-Capillary: 84 mg/dL (ref 70–99)

## 2014-11-22 ENCOUNTER — Emergency Department (HOSPITAL_COMMUNITY)
Admission: EM | Admit: 2014-11-22 | Discharge: 2014-11-22 | Disposition: A | Discharge status: Home / Self Care | Payer: 59 | Attending: Emergency Medicine | Admitting: Emergency Medicine

## 2014-11-22 ENCOUNTER — Encounter (HOSPITAL_COMMUNITY): Payer: Self-pay | Admitting: Emergency Medicine

## 2014-11-22 DIAGNOSIS — H6691 Otitis media, unspecified, right ear: Secondary | ICD-10-CM | POA: Insufficient documentation

## 2014-11-22 DIAGNOSIS — H6091 Unspecified otitis externa, right ear: Secondary | ICD-10-CM | POA: Insufficient documentation

## 2014-11-22 DIAGNOSIS — Z79899 Other long term (current) drug therapy: Secondary | ICD-10-CM | POA: Diagnosis not present

## 2014-11-22 DIAGNOSIS — I10 Essential (primary) hypertension: Secondary | ICD-10-CM | POA: Diagnosis not present

## 2014-11-22 DIAGNOSIS — H9201 Otalgia, right ear: Secondary | ICD-10-CM | POA: Diagnosis present

## 2014-11-22 MED ORDER — OXYCODONE-ACETAMINOPHEN 5-325 MG PO TABS: 1.0000 | ORAL_TABLET | Freq: Once | ORAL | Status: DC

## 2014-11-22 MED ORDER — ANTIPYRINE-BENZOCAINE 5.4-1.4 % OT SOLN: 3.0000 [drp] | OTIC | Status: DC | PRN

## 2014-11-22 MED ORDER — OXYCODONE-ACETAMINOPHEN 5-325 MG PO TABS: 1.0000 | ORAL_TABLET | ORAL | Status: DC | PRN

## 2014-11-22 MED ORDER — AMOXICILLIN 500 MG PO CAPS: 500.0000 mg | ORAL_CAPSULE | Freq: Three times a day (TID) | ORAL | Status: DC

## 2014-11-22 MED ORDER — NEOMYCIN-POLYMYXIN-HC 3.5-10000-1 OT SUSP: 4.0000 [drp] | Freq: Four times a day (QID) | OTIC | Status: DC

## 2014-11-22 NOTE — Discharge Instructions (Signed)
Read the information below.  Use the prescribed medication as directed.  Please discuss all new medications with your pharmacist.  Do not take additional tylenol while taking the prescribed pain medication to avoid overdose.  You may return to the Emergency Department at any time for worsening condition or any new symptoms that concern you.  If there is any possibility that you might be pregnant, please let your health care provider know and discuss this with the pharmacist to ensure medication safety.  If you develop high fevers that do not resolve with tylenol or ibuprofen, have blood coming from your ear, have uncontrolled pain return to the ER for a recheck.      Otitis Media Otitis media is redness, soreness, and inflammation of the middle ear. Otitis media may be caused by allergies or, most commonly, by infection. Often it occurs as a complication of the common cold. SIGNS AND SYMPTOMS Symptoms of otitis media may include:  Earache.  Fever.  Ringing in your ear.  Headache.  Leakage of fluid from the ear. DIAGNOSIS To diagnose otitis media, your health care provider will examine your ear with an otoscope. This is an instrument that allows your health care provider to see into your ear in order to examine your eardrum. Your health care provider also will ask you questions about your symptoms. TREATMENT  Typically, otitis media resolves on its own within 3-5 days. Your health care provider may prescribe medicine to ease your symptoms of pain. If otitis media does not resolve within 5 days or is recurrent, your health care provider may prescribe antibiotic medicines if he or she suspects that a bacterial infection is the cause. HOME CARE INSTRUCTIONS   If you were prescribed an antibiotic medicine, finish it all even if you start to feel better.  Take medicines only as directed by your health care provider.  Keep all follow-up visits as directed by your health care provider. SEEK  MEDICAL CARE IF:  You have otitis media only in one ear, or bleeding from your nose, or both.  You notice a lump on your neck.  You are not getting better in 3-5 days.  You feel worse instead of better. SEEK IMMEDIATE MEDICAL CARE IF:   You have pain that is not controlled with medicine.  You have swelling, redness, or pain around your ear or stiffness in your neck.  You notice that part of your face is paralyzed.  You notice that the bone behind your ear (mastoid) is tender when you touch it. MAKE SURE YOU:   Understand these instructions.  Will watch your condition.  Will get help right away if you are not doing well or get worse. Document Released: 09/03/2004 Document Revised: 04/15/2014 Document Reviewed: 06/26/2013 [facility] Patient Information 2015 [place], [place]. This information is not intended to replace advice given to you by your health care provider. Make sure you discuss any questions you have with your health care provider.  Otitis Externa Otitis externa is a bacterial or fungal infection of the outer ear canal. This is the area from the eardrum to the outside of the ear. Otitis externa is sometimes called "swimmer's ear." CAUSES  Possible causes of infection include:  Swimming in dirty water.  Moisture remaining in the ear after swimming or bathing.  Mild injury (trauma) to the ear.  Objects stuck in the ear (foreign body).  Cuts or scrapes (abrasions) on the outside of the ear. SIGNS AND SYMPTOMS  The first symptom of infection is  often itching in the ear canal. Later signs and symptoms may include swelling and redness of the ear canal, ear pain, and yellowish-white fluid (pus) coming from the ear. The ear pain may be worse when pulling on the earlobe. DIAGNOSIS  Your health care provider will perform a physical exam. A sample of fluid may be taken from the ear and examined for bacteria or fungi. TREATMENT  Antibiotic ear drops are often given for 10 to  14 days. Treatment may also include pain medicine or corticosteroids to reduce itching and swelling. HOME CARE INSTRUCTIONS   Apply antibiotic ear drops to the ear canal as prescribed by your health care provider.  Take medicines only as directed by your health care provider.  If you have diabetes, follow any additional treatment instructions from your health care provider.  Keep all follow-up visits as directed by your health care provider. PREVENTION   Keep your ear dry. Use the corner of a towel to absorb water out of the ear canal after swimming or bathing.  Avoid scratching or putting objects inside your ear. This can damage the ear canal or remove the protective wax that lines the canal. This makes it easier for bacteria and fungi to grow.  Avoid swimming in lakes, polluted water, or poorly chlorinated pools.  You may use ear drops made of rubbing alcohol and vinegar after swimming. Combine equal parts of white vinegar and alcohol in a bottle. Put 3 or 4 drops into each ear after swimming. SEEK MEDICAL CARE IF:   You have a fever.  Your ear is still red, swollen, painful, or draining pus after 3 days.  Your redness, swelling, or pain gets worse.  You have a severe headache.  You have redness, swelling, pain, or tenderness in the area behind your ear. MAKE SURE YOU:   Understand these instructions.  Will watch your condition.  Will get help right away if you are not doing well or get worse. Document Released: 11/29/2005 Document Revised: 04/15/2014 Document Reviewed: 12/16/2011 [facility] Patient Information 2015 [place]Care, [place]. This information is not intended to replace advice given to you by your health care provider. Make sure you discuss any questions you have with your health care provider.

## 2014-11-22 NOTE — ED Provider Notes (Signed)
CSN: [id number]     Arrival date & time 11/22/14  0901 History  This chart was scribed for non-physician practitioner working with Audree CamelScott T Goldston, MD by Elveria Risingimelie Horne, ED Scribe. This patient was seen in room TR07C/TR07C and the patient's care was started at 9:23 AM.   Chief Complaint  Patient presents with  . Otalgia   The history is provided by the patient. No language interpreter was used.   HPI Comments: Cassandra SeminoleMaggie L Sandhu is a 33 y.o. female with PMHx of Hypertension who presents to the Emergency Department complaining of worsening right ear pain with onset last night. Patient reports dull ear pain that has progressed to pressure likened to having water in her ear. Patient reports when swallowing or yawning she feels that her ear may pop, but it doesn't. Patient currently reports pain rated at 8/10. Patient reports cough earlier this week that has resolved and denies other cold symptoms. Patient reports treatment with Debrox last night, but denies relief. Patient denies similar pain in her left ear. Patient denies ear discharge, sinus pressure, fever, shortness of breath, chest pain, nausea, vomiting or diarrhea. 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   Past Medical History  Diagnosis Date  . Hypertension    History reviewed. No pertinent past surgical history. No family history on file. History  Substance Use Topics  . Smoking status: Never Smoker   . Smokeless tobacco: Not on file  . Alcohol Use: Yes   OB History    No data available     Review of Systems  Constitutional: Negative for fever and chills.  HENT: Positive for ear pain. Negative for ear discharge and sore throat.   Respiratory: Negative for cough and shortness of breath.   Cardiovascular: Negative for chest pain.  Gastrointestinal: Negative for nausea, vomiting and diarrhea.  Skin: Negative for rash.    Allergies  Bee venom and Shellfish allergy  Home Medications   Prior to Admission  medications   Medication Sig Start Date End Date Taking? Authorizing Provider  amLODipine (NORVASC) 10 MG tablet Take 10 mg by mouth daily.    Historical Provider, MD  amLODipine (NORVASC) 10 MG tablet Take 1 tablet (10 mg total) by mouth daily. 08/07/14   Kristen N Ward, DO  valsartan (DIOVAN) 320 MG tablet Take 320 mg by mouth daily.    Historical Provider, MD  valsartan (DIOVAN) 320 MG tablet Take 1 tablet (320 mg total) by mouth daily. 08/07/14   Kristen N Ward, DO   Triage Vitals: BP 172/99 mmHg  Pulse 103  Temp(Src) 98.3 F (36.8 C) (Oral)  Resp 20  Ht 5\' 5"  (1.651 m)  Wt 300 lb (136.079 kg)  BMI 49.92 kg/m2  SpO2 98%  LMP 11/13/2014 Physical Exam  Constitutional: She is oriented to person, place, and time. She appears well-developed and well-nourished. No distress.  HENT:  Head: Normocephalic and atraumatic.  Right Ear: No drainage. No mastoid tenderness.  Left Ear: Tympanic membrane and ear canal normal. No drainage, swelling or tenderness. No mastoid tenderness.  Nose: Right sinus exhibits no maxillary sinus tenderness and no frontal sinus tenderness. Left sinus exhibits no maxillary sinus tenderness and no frontal sinus tenderness.  Mouth/Throat: Uvula is midline and oropharynx is clear and moist. Mucous membranes are not dry. No oropharyngeal exudate, posterior oropharyngeal edema, posterior oropharyngeal erythema or tonsillar abscesses.  Right ear: pain with palpation of tragus and traction of the pinna canal is erythematous and edematous. TM is abnormal  bulging and erythematous.    Eyes: EOM are normal.  Neck: Normal range of motion. Neck supple. No tracheal deviation present.  Cardiovascular: Normal rate.   Pulmonary/Chest: Effort normal. No respiratory distress.  Musculoskeletal: Normal range of motion.  Lymphadenopathy:       Head (right side): No preauricular and no posterior auricular adenopathy present.    She has no cervical adenopathy.  Neurological: She is alert  and oriented to person, place, and time.  Skin: Skin is warm and dry.  Psychiatric: She has a normal mood and affect. Her behavior is normal.  Nursing note and vitals reviewed.   ED Course  Procedures (including critical care time)  COORDINATION OF CARE: 9:28 AM- Discussed treatment plan with patient at bedside and patient agreed to plan.   Labs Review Labs Reviewed - No data to display  Imaging Review No results found.   EKG Interpretation None      MDM   Final diagnoses:  Right otitis externa  Acute right otitis media, recurrence not specified, unspecified otitis media type    Afebrile, nontoxic patient with OM and OE.  No e/o mastoiditis.  No TM rupture.   D/C home with amoxicillin, auralgan, cortisporin otic, percocet (pt doesn't tolerate norco).  PCP follow up.  Discussed result, findings, treatment, and follow up  with patient.  Pt given return precautions.  Pt verbalizes understanding and agrees with plan.       I personally performed the services described in this documentation, which was scribed in my presence. The recorded information has been reviewed and is accurate.    Trixie Dredgemily Jezreel Sisk, PA-C 11/22/14 1006  Audree CamelScott T Goldston, MD 11/22/14 [phone]

## 2014-11-22 NOTE — ED Notes (Signed)
Patient states started getting earache last night.  Patient states "feels like water".   Patient states "alot of pain".

## 2016-06-10 ENCOUNTER — Encounter (HOSPITAL_COMMUNITY): Payer: Self-pay

## 2016-06-10 ENCOUNTER — Emergency Department (HOSPITAL_COMMUNITY)
Admission: EM | Admit: 2016-06-10 | Discharge: 2016-06-11 | Disposition: A | Discharge status: Home / Self Care | Payer: Self-pay | Attending: Emergency Medicine | Admitting: Emergency Medicine

## 2016-06-10 DIAGNOSIS — Z79899 Other long term (current) drug therapy: Secondary | ICD-10-CM | POA: Insufficient documentation

## 2016-06-10 DIAGNOSIS — J029 Acute pharyngitis, unspecified: Secondary | ICD-10-CM | POA: Insufficient documentation

## 2016-06-10 DIAGNOSIS — I10 Essential (primary) hypertension: Secondary | ICD-10-CM | POA: Insufficient documentation

## 2016-06-10 LAB — RAPID STREP SCREEN (MED CTR MEBANE ONLY): STREPTOCOCCUS, GROUP A SCREEN (DIRECT): NEGATIVE

## 2016-06-10 NOTE — ED Notes (Signed)
Patient c/o of sore throat x2 days.  Patient with Hx of strep.  Patient with low grade fever.  Patient c/o pain 5/10.  Breathing even and unlabored.  NAD at this time.

## 2016-06-11 NOTE — ED Provider Notes (Signed)
CSN: [id number]     Arrival date & time 06/10/16  2111 History   First MD Initiated Contact with Patient 06/11/16 0021     Chief Complaint  Patient presents with  . Sore Throat     (Consider location/radiation/quality/duration/timing/severity/associated sxs/prior Treatment) Patient is a 35 y.o. female presenting with pharyngitis. The history is provided by the patient. No language interpreter was used.  Sore Throat This is a new problem. The current episode started yesterday. Associated symptoms include a sore throat. Pertinent negatives include no chills, coughing, fever, myalgias, nausea or vomiting. Associated symptoms comments: Patient with a history of strep throat with sore throat starting yesterday with otalgia and headache today. She feels she has strep again. 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 .    Past Medical History  Diagnosis Date  . Hypertension    History reviewed. No pertinent past surgical history. No family history on file. Social History  Substance Use Topics  . Smoking status: Never Smoker   . Smokeless tobacco: None  . Alcohol Use: Yes   OB History    No data available     Review of Systems  Constitutional: Positive for appetite change. Negative for fever and chills.  HENT: Positive for ear pain, sore throat and trouble swallowing.   Respiratory: Negative.  Negative for cough and shortness of breath.   Cardiovascular: Negative.   Gastrointestinal: Negative.  Negative for nausea and vomiting.  Musculoskeletal: Negative.  Negative for myalgias.  Skin: Negative.   Neurological: Negative.       Allergies  Bee venom and Shellfish allergy  Home Medications   Prior to Admission medications   Medication Sig Start Date End Date Taking? Authorizing Provider  amLODipine (NORVASC) 10 MG tablet Take 10 mg by mouth daily.    Historical Provider, MD  amoxicillin (AMOXIL) 500 MG capsule Take 1 capsule (500 mg total) by mouth 3 (three) times  daily. 11/22/14   Trixie DredgeEmily West, PA-C  antipyrine-benzocaine Lyla Son(AURALGAN) otic solution Place 3 drops into the right ear every 2 (two) hours as needed for ear pain. 11/22/14   Trixie DredgeEmily West, PA-C  carbamide peroxide (DEBROX) 6.5 % otic solution Place 5 drops into the right ear 2 (two) times daily.    Historical Provider, MD  neomycin-polymyxin-hydrocortisone (CORTISPORIN) 3.5-10000-1 otic suspension Place 4 drops into both ears 4 (four) times daily. X 7 days 11/22/14   Trixie DredgeEmily West, PA-C  oxyCODONE-acetaminophen (PERCOCET/ROXICET) 5-325 MG per tablet Take 1 tablet by mouth every 4 (four) hours as needed for moderate pain or severe pain. 11/22/14   Trixie DredgeEmily West, PA-C  valsartan (DIOVAN) 320 MG tablet Take 320 mg by mouth daily.    Historical Provider, MD   BP 175/106 mmHg  Pulse 91  Temp(Src) 99 F (37.2 C) (Oral)  Resp 17  Ht 5\' 5"  (1.651 m)  Wt 149.687 kg  BMI 54.91 kg/m2  SpO2 98%  LMP 06/10/2016 Physical Exam  Constitutional: She appears well-developed and well-nourished.  HENT:  Head: Normocephalic.  Mouth/Throat: Uvula is midline. Mucous membranes are not dry. No oropharyngeal exudate or posterior oropharyngeal erythema.  Neck: Normal range of motion. Neck supple.  Cardiovascular: Normal rate and regular rhythm.   Pulmonary/Chest: Effort normal and breath sounds normal.  Abdominal: Soft. Bowel sounds are normal. There is no tenderness. There is no rebound and no guarding.  Musculoskeletal: Normal range of motion.  Neurological: She is alert. No cranial nerve deficit.  Skin: Skin is warm and dry. No rash noted.  Psychiatric: She has a normal mood and affect.    ED Course  Procedures (including critical care time) Labs Review Labs Reviewed  RAPID STREP SCREEN (NOT AT [facility])  CULTURE, GROUP A STREP [facility])   Results for orders placed or performed during the hospital encounter of 06/10/16  Rapid strep screen  Result Value Ref Range   Streptococcus, Group A Screen (Direct) NEGATIVE  NEGATIVE  Culture, group A strep  Result Value Ref Range   Specimen Description THROAT    Special Requests NONE Reflexed from [id number]    Culture      NO GROUP A STREP (S.PYOGENES) ISOLATED Performed at [facility]    Report Status 06/13/2016 FINAL     Imaging Review No results found. I have personally reviewed and evaluated these images and lab results as part of my medical decision-making.   EKG Interpretation None      MDM   Final diagnoses:  None    1. Sore throat  Patient presents with sore throat, no complaint of fever, feels like previous strep throat. Exam is essentially benign. Strep negative. Likely viral process requiring supportive care.     Elpidio AnisShari Alica Shellhammer, PA-C 06/14/16 2109  April Palumbo, MD 07/08/16 [phone]

## 2016-06-11 NOTE — ED Notes (Signed)
Bed: WA19 Expected date:  Expected time:  Means of arrival:  Comments: 

## 2016-06-11 NOTE — ED Notes (Signed)
Pt left with notifying staff

## 2016-06-13 LAB — CULTURE, GROUP A STREP (THRC)

## 2018-03-02 ENCOUNTER — Emergency Department (HOSPITAL_COMMUNITY): Payer: 59

## 2018-03-02 ENCOUNTER — Encounter (HOSPITAL_COMMUNITY): Payer: Self-pay | Admitting: Emergency Medicine

## 2018-03-02 ENCOUNTER — Emergency Department (HOSPITAL_COMMUNITY)
Admission: EM | Admit: 2018-03-02 | Discharge: 2018-03-02 | Disposition: A | Discharge status: Home / Self Care | Payer: 59 | Attending: Emergency Medicine | Admitting: Emergency Medicine

## 2018-03-02 ENCOUNTER — Other Ambulatory Visit: Payer: Self-pay

## 2018-03-02 DIAGNOSIS — Z23 Encounter for immunization: Secondary | ICD-10-CM | POA: Insufficient documentation

## 2018-03-02 DIAGNOSIS — I10 Essential (primary) hypertension: Secondary | ICD-10-CM | POA: Insufficient documentation

## 2018-03-02 DIAGNOSIS — S81012A Laceration without foreign body, left knee, initial encounter: Secondary | ICD-10-CM | POA: Diagnosis not present

## 2018-03-02 DIAGNOSIS — Y9389 Activity, other specified: Secondary | ICD-10-CM | POA: Diagnosis not present

## 2018-03-02 DIAGNOSIS — S51011A Laceration without foreign body of right elbow, initial encounter: Secondary | ICD-10-CM | POA: Insufficient documentation

## 2018-03-02 DIAGNOSIS — Y999 Unspecified external cause status: Secondary | ICD-10-CM | POA: Insufficient documentation

## 2018-03-02 DIAGNOSIS — Y92410 Unspecified street and highway as the place of occurrence of the external cause: Secondary | ICD-10-CM | POA: Diagnosis not present

## 2018-03-02 DIAGNOSIS — S40012A Contusion of left shoulder, initial encounter: Secondary | ICD-10-CM | POA: Diagnosis not present

## 2018-03-02 DIAGNOSIS — S4991XA Unspecified injury of right shoulder and upper arm, initial encounter: Secondary | ICD-10-CM | POA: Diagnosis present

## 2018-03-02 DIAGNOSIS — S40021A Contusion of right upper arm, initial encounter: Secondary | ICD-10-CM

## 2018-03-02 HISTORY — DX: Obesity, unspecified: E66.9

## 2018-03-02 LAB — I-STAT BETA HCG BLOOD, ED (MC, WL, AP ONLY)

## 2018-03-02 MED ORDER — IBUPROFEN 600 MG PO TABS: 600.0000 mg | ORAL_TABLET | Freq: Four times a day (QID) | ORAL | 0 refills | Status: AC | PRN

## 2018-03-02 MED ORDER — HYDROCHLOROTHIAZIDE 25 MG PO TABS: 25.0000 mg | ORAL_TABLET | Freq: Every day | ORAL | 0 refills | Status: DC

## 2018-03-02 MED ORDER — TETANUS-DIPHTH-ACELL PERTUSSIS 5-2.5-18.5 LF-MCG/0.5 IM SUSP
0.5000 mL | Freq: Once | INTRAMUSCULAR | Status: AC
  Administered 2018-03-02: 0.5 mL via INTRAMUSCULAR
  Filled 2018-03-02: qty 0.5

## 2018-03-02 MED ORDER — ACETAMINOPHEN 500 MG PO TABS
1000.0000 mg | ORAL_TABLET | Freq: Once | ORAL | Status: DC
  Filled 2018-03-02: qty 2

## 2018-03-02 NOTE — ED Notes (Signed)
Ordered diet tray for pt  
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
## 2018-03-02 NOTE — ED Notes (Signed)
Husband at bedside.  Abrasions cleaned and dressing applied.
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
## 2018-03-02 NOTE — ED Notes (Signed)
Patients daughter requested Chaplain to call patients mother Andree ElkMaggie Dingle [phone].  I called and got voicemail  Left message that her daughter requested [facility] be [facility] C. Phebe Collaonna S Ericca Labra, Chaplain

## 2018-03-02 NOTE — ED Notes (Signed)
Pt to xray at this time.
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
## 2018-03-03 ENCOUNTER — Encounter (HOSPITAL_COMMUNITY): Payer: Self-pay
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
## 2020-01-08 ENCOUNTER — Ambulatory Visit (INDEPENDENT_AMBULATORY_CARE_PROVIDER_SITE_OTHER): Payer: Self-pay | Admitting: Family Medicine

## 2020-01-22 ENCOUNTER — Ambulatory Visit (INDEPENDENT_AMBULATORY_CARE_PROVIDER_SITE_OTHER): Payer: Self-pay | Admitting: Family Medicine

## 2023-09-05 ENCOUNTER — Telehealth: Payer: No Typology Code available for payment source | Admitting: Physician Assistant

## 2023-09-05 DIAGNOSIS — L304 Erythema intertrigo: Secondary | ICD-10-CM

## 2023-09-05 MED ORDER — NYSTATIN 100000 UNIT/GM EX CREA: 1.0000 | TOPICAL_CREAM | Freq: Two times a day (BID) | CUTANEOUS | 0 refills | Status: DC

## 2023-09-05 NOTE — Progress Notes (Signed)
E Visit for Rash  We are sorry that you are not feeling well. Here is how we plan to help!  Based upon your presentation it appears you have a fungal infection.  I have prescribed: and Nystatin cream apply to the affected area twice daily   HOME CARE:  Take cool showers and avoid direct sunlight. Apply cool compress or wet dressings. Take a bath in an oatmeal bath.  Sprinkle content of one Aveeno packet under running faucet with comfortably warm water.  Bathe for 15-20 minutes, 1-2 times daily.  Pat dry with a towel. Do not rub the rash. Use hydrocortisone cream. Take an antihistamine like Benadryl for widespread rashes that itch.  The adult dose of Benadryl is 25-50 mg by mouth 4 times daily. Caution:  This type of medication may cause sleepiness.  Do not drink alcohol, drive, or operate dangerous machinery while taking antihistamines.  Do not take these medications if you have prostate enlargement.  Read package instructions thoroughly on all medications that you take.  GET HELP RIGHT AWAY IF:  Symptoms don't go away after treatment. Severe itching that persists. If you rash spreads or swells. If you rash begins to smell. If it blisters and opens or develops a yellow-brown crust. You develop a fever. You have a sore throat. You become short of breath.  MAKE SURE YOU:  Understand these instructions. Will watch your condition. Will get help right away if you are not doing well or get worse.  Thank you for choosing an e-visit.  Your e-visit answers were reviewed by a board certified advanced clinical practitioner to complete your personal care plan. Depending upon the condition, your plan could have included both over the counter or prescription medications.  Please review your pharmacy choice. Make sure the pharmacy is open so you can pick up prescription now. If there is a problem, you may contact your provider through [employer] and have the prescription routed to  another pharmacy.  Your safety is important to [place]. If you have drug allergies check your prescription carefully.   For the next 24 hours you can use MyChart to ask questions about today's visit, request a non-urgent call back, or ask for a work or school excuse. You will get an email in the next two days asking about your experience. I hope that your e-visit has been valuable and will speed your recovery.  I have spent 5 minutes in review of e-visit questionnaire, review and updating patient chart, medical decision making and response to patient.   Margaretann Loveless, PA-C

## 2023-09-15 ENCOUNTER — Telehealth: Payer: No Typology Code available for payment source | Admitting: Physician Assistant

## 2023-09-15 DIAGNOSIS — L219 Seborrheic dermatitis, unspecified: Secondary | ICD-10-CM | POA: Diagnosis not present

## 2023-09-15 MED ORDER — KETOCONAZOLE 2 % EX SHAM: 1.0000 | MEDICATED_SHAMPOO | CUTANEOUS | 0 refills | Status: DC

## 2023-09-15 MED ORDER — TRIAMCINOLONE ACETONIDE 0.1 % EX CREA: 1.0000 | TOPICAL_CREAM | Freq: Two times a day (BID) | CUTANEOUS | 0 refills | Status: DC

## 2023-09-15 NOTE — Addendum Note (Signed)
Addended by: Waldon Merl on: 09/15/2023 07:06 PM   Modules accepted: Orders

## 2023-09-15 NOTE — Progress Notes (Signed)
E Visit for Rash  We are sorry that you are not feeling well. Here is how we plan to help!  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 I want you to keep skin clean and dry. Avoid facial soaps or lotions that are heavily scented or dyed.  I am sending in a ketoconazole shampoo that I want you to apply to the area 2-3 times a week, letting sit for a couple of minutes before rinsing. I have also sent in a triamcinolone cream to apply once daily to the area for up to 14 days.  This tends to reoccur but as long as you use the shampoo periodically it should keep it from flaring up. If symptoms continue or worsen despite treatment, we need you to be evaluated in person.  HOME CARE:  Take cool showers and avoid direct sunlight. Apply cool compress or wet dressings. Take a bath in an oatmeal bath.  Sprinkle content of one Aveeno packet under running faucet with comfortably warm water.  Bathe for 15-20 minutes, 1-2 times daily.  Pat dry with a towel. Do not rub the rash. Use hydrocortisone cream. Take an antihistamine like Benadryl for widespread rashes that itch.  The adult dose of Benadryl is 25-50 mg by mouth 4 times daily. Caution:  This type of medication may cause sleepiness.  Do not drink alcohol, drive, or operate dangerous machinery while taking antihistamines.  Do not take these medications if you have prostate enlargement.  Read package instructions thoroughly on all medications that you take.  GET HELP RIGHT AWAY IF:  Symptoms don't go away after treatment. Severe itching that persists. If you rash spreads or swells. If you rash begins to smell. If it blisters and opens or develops a yellow-brown crust. You develop a fever. You have a sore throat. You become short of breath.  MAKE SURE YOU:  Understand these instructions. Will watch your condition. Will get help right away if you are not doing well or get  worse.  Thank you for choosing an e-visit.  Your e-visit answers were reviewed by a board certified advanced clinical practitioner to complete your personal care plan. Depending upon the condition, your plan could have included both over the counter or prescription medications.  Please review your pharmacy choice. Make sure the pharmacy is open so you can pick up prescription now. If there is a problem, you may contact your provider through [employer] and have the prescription routed to another pharmacy.  Your safety is important to [place]. If you have drug allergies check your prescription carefully.   For the next 24 hours you can use MyChart to ask questions about today's visit, request a non-urgent call back, or ask for a work or school excuse. You will get an email in the next two days asking about your experience. I hope that your e-visit has been valuable and will speed your recovery.

## 2023-09-15 NOTE — Progress Notes (Signed)
I have spent 5 minutes in review of e-visit questionnaire, review and updating patient chart, medical decision making and response to patient.   Mia Milan Cody Jacklynn Dehaas, PA-C    

## 2023-09-24 ENCOUNTER — Other Ambulatory Visit: Payer: Self-pay

## 2023-09-24 ENCOUNTER — Ambulatory Visit: Payer: No Typology Code available for payment source | Attending: Sports Medicine

## 2023-09-24 DIAGNOSIS — M5459 Other low back pain: Secondary | ICD-10-CM | POA: Diagnosis present

## 2023-09-24 DIAGNOSIS — M6281 Muscle weakness (generalized): Secondary | ICD-10-CM | POA: Diagnosis present
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
## 2023-09-27 ENCOUNTER — Ambulatory Visit: Payer: No Typology Code available for payment source

## 2023-09-27 DIAGNOSIS — M6281 Muscle weakness (generalized): Secondary | ICD-10-CM

## 2023-09-27 DIAGNOSIS — M5459 Other low back pain: Secondary | ICD-10-CM | POA: Diagnosis not present

## 2023-09-27 NOTE — Therapy (Signed)
OUTPATIENT PHYSICAL THERAPY TREATMENT NOTE   Patient Name: Cassandra Mayer MRN: [id number] DOB:05-12-1981, 42 y.o., female Today's Date: 09/27/2023  END OF SESSION:  PT End of Session - 09/27/23 1643     Visit Number 2    Date for PT Re-Evaluation 11/26/23    Authorization Type [employer]    Authorization Time Period VL 60    PT Start Time 1700    PT Stop Time 1740    PT Time Calculation (min) 40 min    Activity Tolerance Patient tolerated treatment well;Patient limited by pain    Behavior During Therapy WFL for tasks assessed/performed              Past Medical History:  Diagnosis Date   Hypertension    Obesity    Past Surgical History:  Procedure Laterality Date   WISDOM TOOTH EXTRACTION     There are no problems to display for this patient.   PCP: None  REFERRING PROVIDER: Rodolph Bong  REFERRING DIAG: Low back pain  Rationale for Evaluation and Treatment: Rehabilitation  THERAPY DIAG:  Other low back pain  Muscle weakness (generalized)  ONSET DATE: Patient reports July of 2024  SUBJECTIVE:                                                                                                                                                                                           SUBJECTIVE STATEMENT: Patient reports continued lower back pain and that she has had trouble sleeping because of her pain.  PERTINENT HISTORY:  Diabetes, HTN.   PAIN:  Are you having pain? Yes: NPRS scale: 3/10 Pain location: right sided thoracic/lumbar area Pain description: spasms, soreness, deep aching Aggravating factors: Laying on right side, if someone bumps into her, sitting on the floor more than 5-10 minutes at work, stairs, house chores Relieving factors: muscle relaxers, stretching, massage chair, heat  PRECAUTIONS: None  RED FLAGS: None   WEIGHT BEARING RESTRICTIONS: No  FALLS:  Has patient fallen in last 6 months? No  LIVING  ENVIRONMENT: Lives with: lives with their family Lives in: House/apartment Stairs: Yes: Internal: 13 steps; on right going up Has following equipment at home: None  OCCUPATION: Teacher  PLOF: Independent  PATIENT GOALS: Decrease pain, be able to walk and be outside without restriction  NEXT MD VISIT: 10/18  OBJECTIVE:  Note: Objective measures were completed at Evaluation unless otherwise noted.  DIAGNOSTIC FINDINGS:  Patient presents with facet dysfunction and right sided thoracic/lumbar pain  PATIENT SURVEYS:  FOTO administer at next session 09/27/23: 51% predicted 65%  COGNITION: Overall cognitive status: Within functional limits for  tasks assessed     SENSATION: WFL    POSTURE:  Sits with rounded shoulders and places UE on table next to her to prop up in sitting  PALPATION: TTP right lumbar paraspinals, thoracic paraspinals, QL. Significant joint hypomobility right T9-L2  LUMBAR ROM:   AROM eval  Flexion Fingers to mid shin with pain   Extension Unable to perform due to pain  Right lateral flexion Fingers to mid thigh with pain  Left lateral flexion Fingers to mid thigh with pain  Right rotation 50% with pain  Left rotation 25% more painful than right   (Blank rows = not tested)   LOWER EXTREMITY MMT:    MMT Right eval Left eval  Hip flexion 3- unable to hold against resistance 3+ with increased right sided pain  Hip extension    Hip abduction    Hip adduction    Hip internal rotation    Hip external rotation    Knee flexion 3/5 4-  Knee extension 3-/5 with increased pain 4 with increased pain  Ankle dorsiflexion    Ankle plantarflexion    Ankle inversion    Ankle eversion     (Blank rows = not tested)  FUNCTIONAL TESTS:  5 times sit to stand: 21.39 seconds  GAIT: Comments: Walks with slow gait speed. Step to gait pattern and demonstrates increased lateral truncal sway due to pain/weakness of LE. Minimal trunk rotation in guarded  posture  TODAY'S TREATMENT:                     [facility] Adult PT Treatment:                                                DATE: 09/27/23 Therapeutic Exercise: Nustep level 5 x 5 mins LAQ alternating with hip adduction ball squeeze 2x10 Seated hamstring curl GTB 2x10 BIL Bridges 2x10 LTR x10 BIL Seated pball roll outs fwd/lat x10 ea Therapeutic Activity: FOTO 51%                                                                                                            DATE: 09/24/2023    PATIENT EDUCATION:  Education details: Discussed objective findings. Discussed POC including frequency, HEP, and anatomy/physiology of present condition Person educated: Patient Education method: Explanation, Demonstration, and Handouts Education comprehension: verbalized understanding, returned demonstration, and needs further education  HOME EXERCISE PROGRAM: Access Code: [email or web address] URL: [email or web address] Date: 09/24/2023 Prepared by: Rinaldo Ratel Diy  Exercises - Seated Hip Adduction Squeeze with Ball  - 1 x daily - 7 x weekly - 2 sets - 10 reps - Seated Thoracic Flexion and Rotation with Swiss Ball  - 1 x daily - 7 x weekly - 3 sets - 10 reps - Seated March  - 1 x daily - 7 x weekly - 3 sets - 10 reps  ASSESSMENT:  CLINICAL IMPRESSION: Patient presents  to first follow up PT session reporting continued lower back pain that is worse at night and causes her trouble sleeping. Session today focused on gentle proximal hip strengthening and lumbar mobility. Patient was able to tolerate all prescribed exercises with no adverse effects. Patient continues to benefit from skilled PT services and should be progressed as able to improve functional independence.    OBJECTIVE IMPAIRMENTS: decreased endurance, difficulty walking, decreased ROM, decreased strength, and pain.   ACTIVITY LIMITATIONS: carrying, lifting, sitting, standing, squatting, sleeping, stairs, and bed  mobility  PARTICIPATION LIMITATIONS: occupation  PERSONAL FACTORS: 1-2 comorbidities: DM and HTN  are also affecting patient's functional outcome.   REHAB POTENTIAL: Good  CLINICAL DECISION MAKING: Stable/uncomplicated  EVALUATION COMPLEXITY: Low   GOALS: Goals reviewed with patient? No  SHORT TERM GOALS: Target date: 10/22/2023    Patient will be independent with HEP to improve carryover of sessions Baseline: Goal status: INITIAL  2.  Patient will be able to sit on floor greater than 30 minutes without increase in pain to perform work tasks as Runner, broadcasting/film/video Baseline: Unable to sit on floor greater than 5-10 minutes without exacerbation of symptoms Goal status: INITIAL  3.  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  Patient will be able to ascend and descend stairs without pain to navigate home and school Baseline: Pain with stair negotiation  Goal status: INITIAL  3.  Patient will be able to walk greater than 30 minutes without pain to return to exercise routines and be able to perform errands without restriction Baseline: walking less than 20 minutes before pain Goal status: INITIAL  PLAN:  PT FREQUENCY: 2x/week  PT DURATION: 8 weeks  PLANNED INTERVENTIONS: 97146- PT Re-evaluation, 97110-Therapeutic exercises, 97530- Therapeutic activity, 97112- Neuromuscular re-education, 97535- Self Care, 29528- Manual therapy, [id number]- Gait training, [phone]- Aquatic Therapy, Patient/Family education, Stair training, Taping, Dry Needling, Joint mobilization, Joint  manipulation, Spinal manipulation, Spinal mobilization, and Moist heat.  PLAN FOR NEXT SESSION: Core stability, hip strengthening, stair training   Berta Minor, PTA 09/27/2023, 5:37 PM

## 2023-10-05 ENCOUNTER — Encounter: Payer: Self-pay | Admitting: Physical Therapy

## 2023-10-05 ENCOUNTER — Ambulatory Visit: Payer: No Typology Code available for payment source | Admitting: Physical Therapy

## 2023-10-05 DIAGNOSIS — M5459 Other low back pain: Secondary | ICD-10-CM | POA: Diagnosis not present

## 2023-10-05 DIAGNOSIS — M6281 Muscle weakness (generalized): Secondary | ICD-10-CM

## 2023-10-05 NOTE — Therapy (Unsigned)
OUTPATIENT PHYSICAL THERAPY TREATMENT NOTE   Patient Name: Cassandra Mayer MRN: [id number] DOB:04/04/1981, 42 y.o., female Today's Date: 10/06/2023  END OF SESSION:  PT End of Session - 10/05/23 1702     Visit Number 3    Date for PT Re-Evaluation 11/26/23    Authorization Type [employer] plus    Authorization Time Period VL 60    PT Start Time 1701    PT Stop Time 1742    PT Time Calculation (min) 41 min    Activity Tolerance Patient tolerated treatment well;Patient limited by pain    Behavior During Therapy WFL for tasks assessed/performed              Past Medical History:  Diagnosis Date   Hypertension    Obesity    Past Surgical History:  Procedure Laterality Date   WISDOM TOOTH EXTRACTION     There are no problems to display for this patient.   PCP: None  REFERRING PROVIDER: Rodolph Bong  REFERRING DIAG: Low back pain  Rationale for Evaluation and Treatment: Rehabilitation  THERAPY DIAG:  Other low back pain  Muscle weakness (generalized)  ONSET DATE: Patient reports July of 2024  SUBJECTIVE:                                                                                                                                                                                           SUBJECTIVE STATEMENT: Pt reports continued pain, partly attributable to sitting on the floor or small chairs at work.  PERTINENT HISTORY:  Diabetes, HTN.   PAIN:  Are you having pain? Yes: NPRS scale: 3/10 Pain location: right sided thoracic/lumbar area Pain description: spasms, soreness, deep aching Aggravating factors: Laying on right side, if someone bumps into her, sitting on the floor more than 5-10 minutes at work, stairs, house chores Relieving factors: muscle relaxers, stretching, massage chair, heat  PRECAUTIONS: None  RED FLAGS: None   WEIGHT BEARING RESTRICTIONS: No  FALLS:  Has patient fallen in last 6 months? No  LIVING  ENVIRONMENT: Lives with: lives with their family Lives in: House/apartment Stairs: Yes: Internal: 13 steps; on right going up Has following equipment at home: None  OCCUPATION: Teacher  PLOF: Independent  PATIENT GOALS: Decrease pain, be able to walk and be outside without restriction  NEXT MD VISIT: 10/18  OBJECTIVE:  Note: Objective measures were completed at Evaluation unless otherwise noted.  DIAGNOSTIC FINDINGS:  Patient presents with facet dysfunction and right sided thoracic/lumbar pain  PATIENT SURVEYS:  FOTO administer at next session 09/27/23: 51% predicted 65%  COGNITION: Overall cognitive status: Within functional limits for tasks  assessed     SENSATION: WFL    POSTURE:  Sits with rounded shoulders and places UE on table next to her to prop up in sitting  PALPATION: TTP right lumbar paraspinals, thoracic paraspinals, QL. Significant joint hypomobility right T9-L2  LUMBAR ROM:   AROM eval  Flexion Fingers to mid shin with pain   Extension Unable to perform due to pain  Right lateral flexion Fingers to mid thigh with pain  Left lateral flexion Fingers to mid thigh with pain  Right rotation 50% with pain  Left rotation 25% more painful than right   (Blank rows = not tested)   LOWER EXTREMITY MMT:    MMT Right eval Left eval  Hip flexion 3- unable to hold against resistance 3+ with increased right sided pain  Hip extension    Hip abduction    Hip adduction    Hip internal rotation    Hip external rotation    Knee flexion 3/5 4-  Knee extension 3-/5 with increased pain 4 with increased pain  Ankle dorsiflexion    Ankle plantarflexion    Ankle inversion    Ankle eversion     (Blank rows = not tested)  FUNCTIONAL TESTS:  5 times sit to stand: 21.39 seconds  GAIT: Comments: Walks with slow gait speed. Step to gait pattern and demonstrates increased lateral truncal sway due to pain/weakness of LE. Minimal trunk rotation in guarded  posture  TODAY'S TREATMENT:                     [facility] Adult PT Treatment: Therapeutic Exercise: Nustep level 5 x 5 mins Alternating clam - Blue TB - 2x10 Supine hip adduction ball squeeze - 5'' holds - 2x10 S/L clam w/o reistance - 2x15 Bridges 2x10 LTR x10 BIL Seated abdominal isometric with swiss ball - 3'' - 2x10 Seated pball roll outs fwd/lat x10 ea Standing row - Blue TB - 2x10 Standing shoulder ext - GTB - 2x10  Manual Therapy  Manual R QL stretch in L S/L    PATIENT EDUCATION:  Education details: Discussed objective findings. Discussed POC including frequency, HEP, and anatomy/physiology of present condition Person educated: Patient Education method: Explanation, Demonstration, and Handouts Education comprehension: verbalized understanding, returned demonstration, and needs further education  HOME EXERCISE PROGRAM: Access Code: [email or web address] URL: [email or web address] Date: 09/24/2023 Prepared by: Rinaldo Ratel Diy  Exercises - Seated Hip Adduction Squeeze with Ball  - 1 x daily - 7 x weekly - 2 sets - 10 reps - Seated Thoracic Flexion and Rotation with Swiss Ball  - 1 x daily - 7 x weekly - 3 sets - 10 reps - Seated March  - 1 x daily - 7 x weekly - 3 sets - 10 reps  ASSESSMENT:  CLINICAL IMPRESSION: Yvonnia tolerated session well with no adverse reaction.  Concentrated on mainly mat core and hip strengthening.  Pt will increasing discomfort with supine position so moved to standing exercises at end of session.  Manual QL stretch made no significant difference in pain.   OBJECTIVE IMPAIRMENTS: decreased endurance, difficulty walking, decreased ROM, decreased strength, and pain.   ACTIVITY LIMITATIONS: carrying, lifting, sitting, standing, squatting, sleeping, stairs, and bed mobility  PARTICIPATION LIMITATIONS: occupation  PERSONAL FACTORS: 1-2 comorbidities: DM and HTN  are also affecting patient's functional outcome.   REHAB POTENTIAL:  Good  CLINICAL DECISION MAKING: Stable/uncomplicated  EVALUATION COMPLEXITY: Low   GOALS: Goals reviewed with patient? No  SHORT TERM GOALS: Target date: 10/22/2023  Patient will be independent with HEP to improve carryover of sessions Baseline: Goal status: INITIAL  2.  Patient will be able to sit on floor greater than 30 minutes without increase in pain to perform work tasks as Runner, broadcasting/film/video Baseline: Unable to sit on floor greater than 5-10 minutes without exacerbation of symptoms Goal status: INITIAL  3.  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  Patient will be able to ascend and descend stairs without pain to navigate home and school Baseline: Pain with stair negotiation  Goal status: INITIAL  3.  Patient will be able to walk greater than 30 minutes without pain to return to exercise routines and be able to perform errands without restriction Baseline: walking less than 20 minutes before pain Goal status: INITIAL  PLAN:  PT FREQUENCY: 2x/week  PT DURATION: 8 weeks  PLANNED INTERVENTIONS: 97146- PT Re-evaluation, 97110-Therapeutic exercises, 97530- Therapeutic activity, 97112- Neuromuscular re-education, 97535- Self Care, 09811- Manual therapy, [id number]- Gait training, [phone]- Aquatic Therapy, Patient/Family education, Stair training, Taping, Dry Needling, Joint mobilization, Joint manipulation, Spinal manipulation, Spinal mobilization, and Moist heat.  PLAN FOR NEXT SESSION: Core stability, hip strengthening, stair training   Fredderick Phenix,  PT 10/06/2023, 9:03 AM

## 2023-10-12 ENCOUNTER — Ambulatory Visit: Payer: No Typology Code available for payment source | Admitting: Physical Therapy

## 2023-10-12 ENCOUNTER — Encounter: Payer: Self-pay | Admitting: Physical Therapy

## 2023-10-12 DIAGNOSIS — M5459 Other low back pain: Secondary | ICD-10-CM

## 2023-10-12 DIAGNOSIS — M6281 Muscle weakness (generalized): Secondary | ICD-10-CM

## 2023-10-12 NOTE — Therapy (Signed)
OUTPATIENT PHYSICAL THERAPY TREATMENT NOTE   Patient Name: Cassandra Mayer MRN: [id number] DOB:August 07, 1981, 42 y.o., female Today's Date: 10/12/2023  END OF SESSION:  PT End of Session - 10/12/23 1617     Visit Number 4    Date for PT Re-Evaluation 11/26/23    Authorization Type [employer] plus    Authorization Time Period VL 60    PT Start Time 0415    PT Stop Time 0457    PT Time Calculation (min) 42 min    Activity Tolerance Patient tolerated treatment well;Patient limited by pain    Behavior During Therapy WFL for tasks assessed/performed              Past Medical History:  Diagnosis Date   Hypertension    Obesity    Past Surgical History:  Procedure Laterality Date   WISDOM TOOTH EXTRACTION     There are no problems to display for this patient.   PCP: None  REFERRING PROVIDER: Rodolph Bong  REFERRING DIAG: Low back pain  Rationale for Evaluation and Treatment: Rehabilitation  THERAPY DIAG:  Other low back pain  Muscle weakness (generalized)  ONSET DATE: Patient reports July of 2024  SUBJECTIVE:                                                                                                                                                                                           SUBJECTIVE STATEMENT: Pt reports that she was doing pretty well until yesterday.  She had an increase in pain midday yesterday.  She is not sure exactly why but she did start a new pain medication yesterday.  PERTINENT HISTORY:  Diabetes, HTN.   PAIN:  Are you having pain? Yes: NPRS scale: 3/10 Pain location: right sided thoracic/lumbar area Pain description: spasms, soreness, deep aching Aggravating factors: Laying on right side, if someone bumps into her, sitting on the floor more than 5-10 minutes at work, stairs, house chores Relieving factors: muscle relaxers, stretching, massage chair, heat  PRECAUTIONS: None  RED FLAGS: None   WEIGHT BEARING  RESTRICTIONS: No  FALLS:  Has patient fallen in last 6 months? No  LIVING ENVIRONMENT: Lives with: lives with their family Lives in: House/apartment Stairs: Yes: Internal: 13 steps; on right going up Has following equipment at home: None  OCCUPATION: Teacher  PLOF: Independent  PATIENT GOALS: Decrease pain, be able to walk and be outside without restriction  NEXT MD VISIT: 10/18  OBJECTIVE:  Note: Objective measures were completed at Evaluation unless otherwise noted.  DIAGNOSTIC FINDINGS:  Patient presents with facet dysfunction and right sided thoracic/lumbar pain  PATIENT SURVEYS:  FOTO administer at next session 09/27/23: 51% predicted 65%  COGNITION: Overall cognitive status: Within functional limits for tasks assessed     SENSATION: WFL    POSTURE:  Sits with rounded shoulders and places UE on table next to her to prop up in sitting  PALPATION: TTP right lumbar paraspinals, thoracic paraspinals, QL. Significant joint hypomobility right T9-L2  LUMBAR ROM:   AROM eval  Flexion Fingers to mid shin with pain   Extension Unable to perform due to pain  Right lateral flexion Fingers to mid thigh with pain  Left lateral flexion Fingers to mid thigh with pain  Right rotation 50% with pain  Left rotation 25% more painful than right   (Blank rows = not tested)   LOWER EXTREMITY MMT:    MMT Right eval Left eval  Hip flexion 3- unable to hold against resistance 3+ with increased right sided pain  Hip extension    Hip abduction    Hip adduction    Hip internal rotation    Hip external rotation    Knee flexion 3/5 4-  Knee extension 3-/5 with increased pain 4 with increased pain  Ankle dorsiflexion    Ankle plantarflexion    Ankle inversion    Ankle eversion     (Blank rows = not tested)  FUNCTIONAL TESTS:  5 times sit to stand: 21.39 seconds  GAIT: Comments: Walks with slow gait speed. Step to gait pattern and demonstrates increased lateral truncal  sway due to pain/weakness of LE. Minimal trunk rotation in guarded posture  TODAY'S TREATMENT:                     [facility] Adult PT Treatment: Therapeutic Exercise: Nustep level 5 x 5 mins Alternating clam - Blue TB - 2x10 Supine marching with blue TB - 2x10 S/L clam w/o reistance - x15 S/L reverse clam w/o resistance - x15 Bridges 2x10 small arc LTR x10 BIL Seated pball roll outs fwd/lat x10 ea Standing row - Black TB - 2x10 Standing shoulder ext - GTB - 2x10  Manual Therapy  CPA and UPA lower thoracic spine STM R sided thoracic paraspinals and lat  Modalities: 5 min hot pack at end of session   PATIENT EDUCATION:  Education details: Discussed objective findings. Discussed POC including frequency, HEP, and anatomy/physiology of present condition Person educated: Patient Education method: Explanation, Demonstration, and Handouts Education comprehension: verbalized understanding, returned demonstration, and needs further education  HOME EXERCISE PROGRAM: Access Code: [email or web address] URL: [email or web address] Date: 09/24/2023 Prepared by: Rinaldo Ratel Diy  Exercises - Seated Hip Adduction Squeeze with Ball  - 1 x daily - 7 x weekly - 2 sets - 10 reps - Seated Thoracic Flexion and Rotation with Swiss Ball  - 1 x daily - 7 x weekly - 3 sets - 10 reps - Seated March  - 1 x daily - 7 x weekly - 3 sets - 10 reps  ASSESSMENT:  CLINICAL IMPRESSION: Abbagayle tolerated session well with no adverse reaction.  Continued with core and hip strengthening.  HEP updated.  Added in STM with modest improvement in pain following.   OBJECTIVE IMPAIRMENTS: decreased endurance, difficulty walking, decreased ROM, decreased strength, and pain.   ACTIVITY LIMITATIONS: carrying, lifting, sitting, standing, squatting, sleeping, stairs, and bed mobility  PARTICIPATION LIMITATIONS: occupation  PERSONAL FACTORS: 1-2 comorbidities: DM and HTN  are also affecting patient's functional  outcome.   REHAB POTENTIAL: Good  CLINICAL DECISION MAKING: Stable/uncomplicated  EVALUATION COMPLEXITY: Low  GOALS: Goals reviewed with patient? No  SHORT TERM GOALS: Target date: 10/22/2023    Patient will be independent with HEP to improve carryover of sessions Baseline: Goal status: INITIAL  2.  Patient will be able to sit on floor greater than 30 minutes without increase in pain to perform work tasks as Runner, broadcasting/film/video Baseline: Unable to sit on floor greater than 5-10 minutes without exacerbation of symptoms Goal status: INITIAL  3.  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  Patient will be able to ascend and descend stairs without pain to navigate home and school Baseline: Pain with stair negotiation  Goal status: INITIAL  3.  Patient will be able to walk greater than 30 minutes without pain to return to exercise routines and be able to perform errands without restriction Baseline: walking less than 20 minutes before pain Goal status: INITIAL  PLAN:  PT FREQUENCY: 2x/week  PT DURATION: 8 weeks  PLANNED INTERVENTIONS: 97146- PT Re-evaluation, 97110-Therapeutic exercises, 97530- Therapeutic activity, 97112- Neuromuscular re-education, 97535- Self Care, 86578- Manual therapy, [id number]- Gait training, [phone]- Aquatic Therapy, Patient/Family education, Stair training, Taping, Dry Needling, Joint mobilization, Joint manipulation, Spinal manipulation, Spinal mobilization, and Moist heat.  PLAN FOR NEXT SESSION: Core stability, hip strengthening, stair  training   Fredderick Phenix, PT 10/12/2023, 4:58 PM

## 2023-10-15 ENCOUNTER — Ambulatory Visit: Payer: No Typology Code available for payment source | Attending: Sports Medicine | Admitting: Physical Therapy

## 2023-10-15 DIAGNOSIS — M5459 Other low back pain: Secondary | ICD-10-CM | POA: Insufficient documentation

## 2023-10-15 DIAGNOSIS — M6281 Muscle weakness (generalized): Secondary | ICD-10-CM | POA: Diagnosis present

## 2023-10-15 NOTE — Therapy (Signed)
OUTPATIENT PHYSICAL THERAPY TREATMENT NOTE   Patient Name: Cassandra Mayer MRN: [id number] DOB:[birth year], 42 y.o., female Today's Date: 10/15/2023  END OF SESSION:  PT End of Session - 10/15/23 0859     Visit Number 5    Date for PT Re-Evaluation 11/26/23    Authorization Type [employer] plus    Authorization Time Period VL 60    PT Start Time 0900    PT Stop Time 0941    PT Time Calculation (min) 41 min    Activity Tolerance Patient tolerated treatment well;Patient limited by pain    Behavior During Therapy WFL for tasks assessed/performed              Past Medical History:  Diagnosis Date   Hypertension    Obesity    Past Surgical History:  Procedure Laterality Date   WISDOM TOOTH EXTRACTION     There are no problems to display for this patient.   PCP: None  REFERRING PROVIDER: Rodolph Bong  REFERRING DIAG: Low back pain  Rationale for Evaluation and Treatment: Rehabilitation  THERAPY DIAG:  Other low back pain  Muscle weakness (generalized)  ONSET DATE: Patient reports July of 2024  SUBJECTIVE:                                                                                                                                                                                           SUBJECTIVE STATEMENT: Pt reports that her pain has reduced since last visit.  She is still hesitant to lift heavier objects or twist d/t pain.  The pain is often delayed.   PERTINENT HISTORY:  Diabetes, HTN.   PAIN:  Are you having pain? Yes: NPRS scale: 3/10 Pain location: right sided thoracic/lumbar area Pain description: spasms, soreness, deep aching Aggravating factors: Laying on right side, if someone bumps into her, sitting on the floor more than 5-10 minutes at work, stairs, house chores Relieving factors: muscle relaxers, stretching, massage chair, heat  PRECAUTIONS: None  RED FLAGS: None   WEIGHT BEARING RESTRICTIONS: No  FALLS:  Has  patient fallen in last 6 months? No  LIVING ENVIRONMENT: Lives with: lives with their family Lives in: House/apartment Stairs: Yes: Internal: 13 steps; on right going up Has following equipment at home: None  OCCUPATION: Teacher  PLOF: Independent  PATIENT GOALS: Decrease pain, be able to walk and be outside without restriction  NEXT MD VISIT: 10/18  OBJECTIVE:  Note: Objective measures were completed at Evaluation unless otherwise noted.  DIAGNOSTIC FINDINGS:  Patient presents with facet dysfunction and right sided thoracic/lumbar pain  PATIENT SURVEYS:  FOTO administer at next session  09/27/23: 51% predicted 65%  COGNITION: Overall cognitive status: Within functional limits for tasks assessed     SENSATION: WFL    POSTURE:  Sits with rounded shoulders and places UE on table next to her to prop up in sitting  PALPATION: TTP right lumbar paraspinals, thoracic paraspinals, QL. Significant joint hypomobility right T9-L2  LUMBAR ROM:   AROM eval  Flexion Fingers to mid shin with pain   Extension Unable to perform due to pain  Right lateral flexion Fingers to mid thigh with pain  Left lateral flexion Fingers to mid thigh with pain  Right rotation 50% with pain  Left rotation 25% more painful than right   (Blank rows = not tested)   LOWER EXTREMITY MMT:    MMT Right eval Left eval  Hip flexion 3- unable to hold against resistance 3+ with increased right sided pain  Hip extension    Hip abduction    Hip adduction    Hip internal rotation    Hip external rotation    Knee flexion 3/5 4-  Knee extension 3-/5 with increased pain 4 with increased pain  Ankle dorsiflexion    Ankle plantarflexion    Ankle inversion    Ankle eversion     (Blank rows = not tested)  FUNCTIONAL TESTS:  5 times sit to stand: 21.39 seconds  GAIT: Comments: Walks with slow gait speed. Step to gait pattern and demonstrates increased lateral truncal sway due to pain/weakness of LE.  Minimal trunk rotation in guarded posture  TODAY'S TREATMENT:                     [facility] Adult PT Treatment: Therapeutic Exercise: Nustep level 5 x 5 mins Alternating clam - Black TB - 2x10 Supine marching with Black TB - 2x10 Bridges 2x10 small arc Chest press - with OH flexion - 2x10 - 4# S/L open book - 15x Supine fly - 2x10 - 4# ea Seated paloff press against gravity - 4# - 2x10 LTR x10 BIL Seated pball roll outs fwd/lat x10 ea Standing row - Black TB - 2x10 Standing shoulder ext - GTB - 2x10  Manual Therapy  CPA and UPA lower thoracic spine STM R sided thoracic paraspinals and lat  Modalities: 5 min hot pack at end of session   PATIENT EDUCATION:  Education details: Discussed objective findings. Discussed POC including frequency, HEP, and anatomy/physiology of present condition Person educated: Patient Education method: Explanation, Demonstration, and Handouts Education comprehension: verbalized understanding, returned demonstration, and needs further education  HOME EXERCISE PROGRAM: Access Code: [email or web address] URL: [email or web address] Date: 09/24/2023 Prepared by: Rinaldo Ratel Diy  Exercises - Seated Hip Adduction Squeeze with Ball  - 1 x daily - 7 x weekly - 2 sets - 10 reps - Seated Thoracic Flexion and Rotation with Swiss Ball  - 1 x daily - 7 x weekly - 3 sets - 10 reps - Seated March  - 1 x daily - 7 x weekly - 3 sets - 10 reps  ASSESSMENT:  CLINICAL IMPRESSION: Aviana tolerated session well with no adverse reaction.  Continue concentration on core and hip strengthening.  Added in more thoracic mobility and periscapular work today as her main pain complaint at this point is really lower thoracic/upper lumbar.  Tolerated well with fatigue but no increase in pain.   OBJECTIVE IMPAIRMENTS: decreased endurance, difficulty walking, decreased ROM, decreased strength, and pain.   ACTIVITY LIMITATIONS: carrying, lifting, sitting, standing, squatting,  sleeping, stairs, and bed mobility  PARTICIPATION LIMITATIONS: occupation  PERSONAL FACTORS: 1-2 comorbidities: DM and HTN  are also affecting patient's functional outcome.   REHAB POTENTIAL: Good  CLINICAL DECISION MAKING: Stable/uncomplicated  EVALUATION COMPLEXITY: Low   GOALS: Goals reviewed with patient? No  SHORT TERM GOALS: Target date: 10/22/2023    Patient will be independent with HEP to improve carryover of sessions Baseline: Goal status: MET  2.  Patient will be able to sit on floor greater than 30 minutes without increase in pain to perform work tasks as Runner, broadcasting/film/video Baseline: Unable to sit on floor greater than 5-10 minutes without exacerbation of symptoms Goal status: INITIAL  3.  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  Patient will be able to ascend and descend stairs without pain to navigate home and school Baseline: Pain with stair negotiation  Goal status: INITIAL  3.  Patient will be able to walk greater than 30 minutes without pain to return to exercise routines and be able to perform errands without restriction Baseline: walking less than 20 minutes before pain Goal status: INITIAL  PLAN:  PT FREQUENCY: 2x/week  PT DURATION: 8 weeks  PLANNED INTERVENTIONS: 97146- PT Re-evaluation, 97110-Therapeutic exercises, 97530- Therapeutic activity, 97112- Neuromuscular re-education, 97535- Self Care, [id number]- Manual therapy, [id number]- Gait training, [phone]- Aquatic Therapy, Patient/Family education, Stair training, Taping, Dry Needling, Joint  mobilization, Joint manipulation, Spinal manipulation, Spinal mobilization, and Moist heat.  PLAN FOR NEXT SESSION: Core stability, hip strengthening, stair training   Fredderick Phenix, PT 10/15/2023, 9:43 AM

## 2023-10-19 ENCOUNTER — Ambulatory Visit: Payer: No Typology Code available for payment source | Admitting: Physical Therapy

## 2023-10-19 ENCOUNTER — Encounter: Payer: Self-pay | Admitting: Physical Therapy

## 2023-10-19 DIAGNOSIS — M6281 Muscle weakness (generalized): Secondary | ICD-10-CM

## 2023-10-19 DIAGNOSIS — M5459 Other low back pain: Secondary | ICD-10-CM

## 2023-10-19 NOTE — Therapy (Signed)
OUTPATIENT PHYSICAL THERAPY TREATMENT NOTE   Patient Name: Cassandra Mayer MRN: [id number] DOB:[birth year], 42 y.o., female Today's Date: 10/19/2023  END OF SESSION:  PT End of Session - 10/19/23 1615     Visit Number 6    Date for PT Re-Evaluation 11/26/23    Authorization Type [employer] plus    Authorization Time Period VL 60    PT Start Time 1615    PT Stop Time 1658    PT Time Calculation (min) 43 min    Activity Tolerance Patient tolerated treatment well;Patient limited by pain    Behavior During Therapy WFL for tasks assessed/performed              Past Medical History:  Diagnosis Date   Hypertension    Obesity    Past Surgical History:  Procedure Laterality Date   WISDOM TOOTH EXTRACTION     There are no problems to display for this patient.   PCP: None  REFERRING PROVIDER: Rodolph Bong  REFERRING DIAG: Low back pain  Rationale for Evaluation and Treatment: Rehabilitation  THERAPY DIAG:  Other low back pain  Muscle weakness (generalized)  ONSET DATE: Patient reports July of 2024  SUBJECTIVE:                                                                                                                                                                                           SUBJECTIVE STATEMENT: Pt reports that she is feeling pretty good this week.  She has minimal pain except with twisting.   PERTINENT HISTORY:  Diabetes, HTN.   PAIN:  Are you having pain? Yes: NPRS scale: 3/10 Pain location: right sided thoracic/lumbar area Pain description: spasms, soreness, deep aching Aggravating factors: Laying on right side, if someone bumps into her, sitting on the floor more than 5-10 minutes at work, stairs, house chores Relieving factors: muscle relaxers, stretching, massage chair, heat  PRECAUTIONS: None  RED FLAGS: None   WEIGHT BEARING RESTRICTIONS: No  FALLS:  Has patient fallen in last 6 months? No  LIVING  ENVIRONMENT: Lives with: lives with their family Lives in: House/apartment Stairs: Yes: Internal: 13 steps; on right going up Has following equipment at home: None  OCCUPATION: Teacher  PLOF: Independent  PATIENT GOALS: Decrease pain, be able to walk and be outside without restriction  NEXT MD VISIT: 10/18  OBJECTIVE:  Note: Objective measures were completed at Evaluation unless otherwise noted.  DIAGNOSTIC FINDINGS:  Patient presents with facet dysfunction and right sided thoracic/lumbar pain  PATIENT SURVEYS:  FOTO administer at next session 09/27/23: 51% predicted 65%  COGNITION: Overall cognitive status: Within functional  limits for tasks assessed     SENSATION: WFL    POSTURE:  Sits with rounded shoulders and places UE on table next to her to prop up in sitting  PALPATION: TTP right lumbar paraspinals, thoracic paraspinals, QL. Significant joint hypomobility right T9-L2  LUMBAR ROM:   AROM eval  Flexion Fingers to mid shin with pain   Extension Unable to perform due to pain  Right lateral flexion Fingers to mid thigh with pain  Left lateral flexion Fingers to mid thigh with pain  Right rotation 50% with pain  Left rotation 25% more painful than right   (Blank rows = not tested)   LOWER EXTREMITY MMT:    MMT Right eval Left eval  Hip flexion 3- unable to hold against resistance 3+ with increased right sided pain  Hip extension    Hip abduction    Hip adduction    Hip internal rotation    Hip external rotation    Knee flexion 3/5 4-  Knee extension 3-/5 with increased pain 4 with increased pain  Ankle dorsiflexion    Ankle plantarflexion    Ankle inversion    Ankle eversion     (Blank rows = not tested)  FUNCTIONAL TESTS:  5 times sit to stand: 21.39 seconds  GAIT: Comments: Walks with slow gait speed. Step to gait pattern and demonstrates increased lateral truncal sway due to pain/weakness of LE. Minimal trunk rotation in guarded  posture  TODAY'S TREATMENT:                     [facility] Adult PT Treatment: Therapeutic Exercise: Nustep level 5 x 5 mins LE X-over -  S/L clam - blue TB - 2x10 Supine marching with Black TB - 2x10 Bridge with blue TB - 3x10 Supine 90-90 - 5'' hold x10 Bil shoulder ext with band - Black TB KB Twists - 2x10 Shoulder flexion - 2x10 KB paloff press - 10# - 2x10 Sit to stand from raised table - 2x10 - 10# Paloff press - 2x10 - 10#  PATIENT EDUCATION:  Education details: Discussed objective findings. Discussed POC including frequency, HEP, and anatomy/physiology of present condition Person educated: Patient Education method: Explanation, Demonstration, and Handouts Education comprehension: verbalized understanding, returned demonstration, and needs further education  HOME EXERCISE PROGRAM: Access Code: [email or web address] URL: [email or web address] Date: 09/24/2023 Prepared by: Rinaldo Ratel Diy  Exercises - Seated Hip Adduction Squeeze with Ball  - 1 x daily - 7 x weekly - 2 sets - 10 reps - Seated Thoracic Flexion and Rotation with Swiss Ball  - 1 x daily - 7 x weekly - 3 sets - 10 reps - Seated March  - 1 x daily - 7 x weekly - 3 sets - 10 reps  ASSESSMENT:  CLINICAL IMPRESSION: Delayla tolerated session well with no adverse reaction.  Pt with improved tolerance to activity today.  Began work on standing core strengthening to good effect.  Pt with no reported increase in pain following therapy.  May consider D/L progression.   OBJECTIVE IMPAIRMENTS: decreased endurance, difficulty walking, decreased ROM, decreased strength, and pain.   ACTIVITY LIMITATIONS: carrying, lifting, sitting, standing, squatting, sleeping, stairs, and bed mobility  PARTICIPATION LIMITATIONS: occupation  PERSONAL FACTORS: 1-2 comorbidities: DM and HTN  are also affecting patient's functional outcome.   REHAB POTENTIAL: Good  CLINICAL DECISION MAKING: Stable/uncomplicated  EVALUATION  COMPLEXITY: Low   GOALS: Goals reviewed with patient? No  SHORT TERM GOALS: Target date: 10/22/2023  Patient will be independent with HEP to improve carryover of sessions Baseline: Goal status: MET  2.  Patient will be able to sit on floor greater than 30 minutes without increase in pain to perform work tasks as Runner, broadcasting/film/video Baseline: Unable to sit on floor greater than 5-10 minutes without exacerbation of symptoms Goal status: INITIAL  3.  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  Patient will be able to ascend and descend stairs without pain to navigate home and school Baseline: Pain with stair negotiation  Goal status: INITIAL  3.  Patient will be able to walk greater than 30 minutes without pain to return to exercise routines and be able to perform errands without restriction Baseline: walking less than 20 minutes before pain Goal status: INITIAL  PLAN:  PT FREQUENCY: 2x/week  PT DURATION: 8 weeks  PLANNED INTERVENTIONS: 97146- PT Re-evaluation, 97110-Therapeutic exercises, 97530- Therapeutic activity, 97112- Neuromuscular re-education, 97535- Self Care, 16109- Manual therapy, [id number]- Gait training, [phone]- Aquatic Therapy, Patient/Family education, Stair training, Taping, Dry Needling, Joint mobilization, Joint manipulation, Spinal manipulation, Spinal mobilization, and Moist heat.  PLAN FOR NEXT SESSION: Core stability, hip strengthening, stair training   Fredderick Phenix, PT 10/19/2023, 4:59 PM

## 2023-10-26 ENCOUNTER — Encounter: Payer: Self-pay | Admitting: Physical Therapy

## 2023-10-26 ENCOUNTER — Ambulatory Visit: Payer: No Typology Code available for payment source | Admitting: Physical Therapy

## 2023-10-26 DIAGNOSIS — M5459 Other low back pain: Secondary | ICD-10-CM

## 2023-10-26 DIAGNOSIS — M6281 Muscle weakness (generalized): Secondary | ICD-10-CM

## 2023-10-26 NOTE — Therapy (Signed)
OUTPATIENT PHYSICAL THERAPY TREATMENT NOTE   Patient Name: Cassandra Mayer MRN: [id number] DOB:19-Nov-1981, 42 y.o., female Today's Date: 10/26/2023  END OF SESSION:  PT End of Session - 10/26/23 1615     Visit Number 7    Date for PT Re-Evaluation 11/26/23    Authorization Type [employer] plus    Authorization Time Period VL 60    PT Start Time 1615    PT Stop Time 1658    PT Time Calculation (min) 43 min    Activity Tolerance Patient tolerated treatment well;Patient limited by pain    Behavior During Therapy WFL for tasks assessed/performed              Past Medical History:  Diagnosis Date   Hypertension    Obesity    Past Surgical History:  Procedure Laterality Date   WISDOM TOOTH EXTRACTION     There are no problems to display for this patient.   PCP: None  REFERRING PROVIDER: Rodolph Bong  REFERRING DIAG: Low back pain  Rationale for Evaluation and Treatment: Rehabilitation  THERAPY DIAG:  Other low back pain  Muscle weakness (generalized)  ONSET DATE: Patient reports July of 2024  SUBJECTIVE:                                                                                                                                                                                           SUBJECTIVE STATEMENT: Pt reports continued improvement in her low back pain.  She is still getting some pain when lying on the R side for an extended period.   PERTINENT HISTORY:  Diabetes, HTN.   PAIN:  Are you having pain? Yes: NPRS scale: 3/10 Pain location: right sided thoracic/lumbar area Pain description: spasms, soreness, deep aching Aggravating factors: Laying on right side, if someone bumps into her, sitting on the floor more than 5-10 minutes at work, stairs, house chores Relieving factors: muscle relaxers, stretching, massage chair, heat  PRECAUTIONS: None  RED FLAGS: None   WEIGHT BEARING RESTRICTIONS: No  FALLS:  Has patient fallen in  last 6 months? No  LIVING ENVIRONMENT: Lives with: lives with their family Lives in: House/apartment Stairs: Yes: Internal: 13 steps; on right going up Has following equipment at home: None  OCCUPATION: Teacher  PLOF: Independent  PATIENT GOALS: Decrease pain, be able to walk and be outside without restriction  NEXT MD VISIT: 10/18  OBJECTIVE:  Note: Objective measures were completed at Evaluation unless otherwise noted.  DIAGNOSTIC FINDINGS:  Patient presents with facet dysfunction and right sided thoracic/lumbar pain  PATIENT SURVEYS:  FOTO administer at next session 09/27/23: 51% predicted  65%  COGNITION: Overall cognitive status: Within functional limits for tasks assessed     SENSATION: WFL    POSTURE:  Sits with rounded shoulders and places UE on table next to her to prop up in sitting  PALPATION: TTP right lumbar paraspinals, thoracic paraspinals, QL. Significant joint hypomobility right T9-L2  LUMBAR ROM:   AROM eval  Flexion Fingers to mid shin with pain   Extension Unable to perform due to pain  Right lateral flexion Fingers to mid thigh with pain  Left lateral flexion Fingers to mid thigh with pain  Right rotation 50% with pain  Left rotation 25% more painful than right   (Blank rows = not tested)   LOWER EXTREMITY MMT:    MMT Right eval Left eval  Hip flexion 3- unable to hold against resistance 3+ with increased right sided pain  Hip extension    Hip abduction    Hip adduction    Hip internal rotation    Hip external rotation    Knee flexion 3/5 4-  Knee extension 3-/5 with increased pain 4 with increased pain  Ankle dorsiflexion    Ankle plantarflexion    Ankle inversion    Ankle eversion     (Blank rows = not tested)  FUNCTIONAL TESTS:  5 times sit to stand: 21.39 seconds  GAIT: Comments: Walks with slow gait speed. Step to gait pattern and demonstrates increased lateral truncal sway due to pain/weakness of LE. Minimal trunk  rotation in guarded posture  TODAY'S TREATMENT:                     [facility] Adult PT Treatment: Therapeutic Exercise: Nustep level 5 x 5 mins LE X-over - 10x S/L clam - blue TB - 2x10 S/L hip abd Dead bug with knee ext - 2x10 Bridge with march - 3x10 Bil shoulder ext with band - Black TB Cable chop - 10# - 2x10 KB paloff press - 10# - 2x10 With rotation x4 with some pain Sit to stand from raised table - 2x10 - 10# Paloff press - 2x10 - 10# QL 10# lift in standing - 2x10 10# Standing row - 17# - 2x10  PATIENT EDUCATION:  Education details: Discussed objective findings. Discussed POC including frequency, HEP, and anatomy/physiology of present condition Person educated: Patient Education method: Explanation, Demonstration, and Handouts Education comprehension: verbalized understanding, returned demonstration, and needs further education  HOME EXERCISE PROGRAM: Access Code: [email or web address] URL: [email or web address] Date: 09/24/2023 Prepared by: Rinaldo Ratel Diy  Exercises - Seated Hip Adduction Squeeze with Ball  - 1 x daily - 7 x weekly - 2 sets - 10 reps - Seated Thoracic Flexion and Rotation with Swiss Ball  - 1 x daily - 7 x weekly - 3 sets - 10 reps - Seated March  - 1 x daily - 7 x weekly - 3 sets - 10 reps  ASSESSMENT:  CLINICAL IMPRESSION: Millena tolerated session well with no adverse reaction.  Continue to progress core strengthening exercises with good tolerance.  Now working on improving anti-rotation exercises in preparation for rotational movements which can still be agging.   OBJECTIVE IMPAIRMENTS: decreased endurance, difficulty walking, decreased ROM, decreased strength, and pain.   ACTIVITY LIMITATIONS: carrying, lifting, sitting, standing, squatting, sleeping, stairs, and bed mobility  PARTICIPATION LIMITATIONS: occupation  PERSONAL FACTORS: 1-2 comorbidities: DM and HTN  are also affecting patient's functional outcome.   REHAB POTENTIAL:  Good  CLINICAL DECISION MAKING: Stable/uncomplicated  EVALUATION COMPLEXITY: Low  GOALS: Goals reviewed with patient? No  SHORT TERM GOALS: Target date: 10/22/2023    Patient will be independent with HEP to improve carryover of sessions Baseline: Goal status: MET  2.  Patient will be able to sit on floor greater than 30 minutes without increase in pain to perform work tasks as Runner, broadcasting/film/video Baseline: Unable to sit on floor greater than 5-10 minutes without exacerbation of symptoms Goal status: MET  3.  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  Patient will be able to ascend and descend stairs without pain to navigate home and school Baseline: Pain with stair negotiation  Goal status: INITIAL  3.  Patient will be able to walk greater than 30 minutes without pain to return to exercise routines and be able to perform errands without restriction Baseline: walking less than 20 minutes before pain Goal status: INITIAL  PLAN:  PT FREQUENCY: 2x/week  PT DURATION: 8 weeks  PLANNED INTERVENTIONS: 97146- PT Re-evaluation, 97110-Therapeutic exercises, 97530- Therapeutic activity, 97112- Neuromuscular re-education, 97535- Self Care, 06237- Manual therapy, [id number]- Gait training, [phone]- Aquatic Therapy, Patient/Family education, Stair training, Taping, Dry Needling, Joint mobilization, Joint manipulation, Spinal manipulation, Spinal mobilization, and Moist heat.  PLAN FOR NEXT SESSION: Core stability, hip strengthening, stair training   Fredderick Phenix,  PT 10/26/2023, 4:59 PM

## 2023-10-29 ENCOUNTER — Ambulatory Visit: Payer: No Typology Code available for payment source

## 2023-10-29 DIAGNOSIS — M6281 Muscle weakness (generalized): Secondary | ICD-10-CM

## 2023-10-29 DIAGNOSIS — M5459 Other low back pain: Secondary | ICD-10-CM | POA: Diagnosis not present

## 2023-10-29 NOTE — Therapy (Signed)
OUTPATIENT PHYSICAL THERAPY TREATMENT NOTE   Patient Name: Cassandra Mayer MRN: [id number] DOB:09/22/1981, 42 y.o., female Today's Date: 10/29/2023  END OF SESSION:  PT End of Session - 10/29/23 0944     Visit Number 8    Date for PT Re-Evaluation 11/26/23    Authorization Type [employer] plus    Authorization Time Period VL 60    PT Start Time 0945    PT Stop Time 1023    PT Time Calculation (min) 38 min    Activity Tolerance Patient tolerated treatment well;Patient limited by pain    Behavior During Therapy WFL for tasks assessed/performed             Past Medical History:  Diagnosis Date   Hypertension    Obesity    Past Surgical History:  Procedure Laterality Date   WISDOM TOOTH EXTRACTION     There are no problems to display for this patient.   PCP: None  REFERRING PROVIDER: Rodolph Bong  REFERRING DIAG: Low back pain  Rationale for Evaluation and Treatment: Rehabilitation  THERAPY DIAG:  Other low back pain  Muscle weakness (generalized)  ONSET DATE: Patient reports July of 2024  SUBJECTIVE:                                                                                                                                                                                           SUBJECTIVE STATEMENT: Patient reports that she does not have any lower back pain this morning and felt fine after last session. She reports that she has been getting on the floor for work and that it has gotten easier. Laying on the Rt side and twisting still continues to cause her the most pain.   PERTINENT HISTORY:  Diabetes, HTN.   PAIN:  Are you having pain? Yes: NPRS scale: 0/10 Pain location: right sided thoracic/lumbar area Pain description: spasms, soreness, deep aching Aggravating factors: Laying on right side, if someone bumps into her, sitting on the floor more than 5-10 minutes at work, stairs, house chores Relieving factors: muscle relaxers,  stretching, massage chair, heat  PRECAUTIONS: None  RED FLAGS: None   WEIGHT BEARING RESTRICTIONS: No  FALLS:  Has patient fallen in last 6 months? No  LIVING ENVIRONMENT: Lives with: lives with their family Lives in: House/apartment Stairs: Yes: Internal: 13 steps; on right going up Has following equipment at home: None  OCCUPATION: Teacher  PLOF: Independent  PATIENT GOALS: Decrease pain, be able to walk and be outside without restriction  NEXT MD VISIT: 10/18  OBJECTIVE:  Note: Objective measures were completed at Evaluation unless otherwise noted.  DIAGNOSTIC FINDINGS:  Patient presents with facet dysfunction and right sided thoracic/lumbar pain  PATIENT SURVEYS:  FOTO administer at next session 09/27/23: 51% predicted 65%  COGNITION: Overall cognitive status: Within functional limits for tasks assessed     SENSATION: WFL    POSTURE:  Sits with rounded shoulders and places UE on table next to her to prop up in sitting  PALPATION: TTP right lumbar paraspinals, thoracic paraspinals, QL. Significant joint hypomobility right T9-L2  LUMBAR ROM:   AROM eval  Flexion Fingers to mid shin with pain   Extension Unable to perform due to pain  Right lateral flexion Fingers to mid thigh with pain  Left lateral flexion Fingers to mid thigh with pain  Right rotation 50% with pain  Left rotation 25% more painful than right   (Blank rows = not tested)   LOWER EXTREMITY MMT:    MMT Right eval Left eval  Hip flexion 3- unable to hold against resistance 3+ with increased right sided pain  Hip extension    Hip abduction    Hip adduction    Hip internal rotation    Hip external rotation    Knee flexion 3/5 4-  Knee extension 3-/5 with increased pain 4 with increased pain  Ankle dorsiflexion    Ankle plantarflexion    Ankle inversion    Ankle eversion     (Blank rows = not tested)  FUNCTIONAL TESTS:  5 times sit to stand: 21.39 seconds  GAIT: Comments:  Walks with slow gait speed. Step to gait pattern and demonstrates increased lateral truncal sway due to pain/weakness of LE. Minimal trunk rotation in guarded posture  TODAY'S TREATMENT:          [facility] Adult PT Treatment:                                                DATE: 10/29/23 Therapeutic Exercise: Nustep level 5 x 5 mins LE X-over - 10x S/L clam - blue TB - 2x10 Dead bug with knee ext - 3x10 Bridge with march - 3x10 Bil shoulder ext with band - Black TB Cable chop - 10# - 2x10 BIL Sit to stand from raised table - 2x10 - 10# Paloff press with rotation - 2x10 - 10# QL 10# lift in standing - 2x10 (1 set on Lt, painful on R side lower back) Standing row - 17# - 2x10              [facility] Adult PT Treatment: Therapeutic Exercise: Nustep level 5 x 5 mins LE X-over - 10x S/L clam - blue TB - 2x10 S/L hip abd Dead bug with knee ext - 2x10 Bridge with march - 3x10 Bil shoulder ext with band - Black TB Cable chop - 10# - 2x10 KB paloff press - 10# - 2x10 With rotation x4 with some pain Sit to stand from raised table - 2x10 - 10# Paloff press - 2x10 - 10# QL 10# lift in standing - 2x10 10# Standing row - 17# - 2x10  PATIENT EDUCATION:  Education details: Discussed objective findings. Discussed POC including frequency, HEP, and anatomy/physiology of present condition Person educated: Patient Education method: Explanation, Demonstration, and Handouts Education comprehension: verbalized understanding, returned demonstration, and needs further education  HOME EXERCISE PROGRAM: Access Code: [email or web address] URL: [email or web address] Date: 09/24/2023 Prepared by: Rinaldo Ratel Diy  Exercises -  Seated Hip Adduction Squeeze with Ball  - 1 x daily - 7 x weekly - 2 sets - 10 reps - Seated Thoracic Flexion and Rotation with Swiss Ball  - 1 x daily - 7 x weekly - 3 sets - 10 reps - Seated March  - 1 x daily - 7 x weekly - 3 sets - 10 reps  ASSESSMENT:  CLINICAL  IMPRESSION: Patient presents to PT reporting continued pain with rotational movements and when lying on the Rt side. Session today focused on core strengthening especially with rotation. She had an increase in pain with QL KB lift, terminated these early d/t pain. Patient was able to tolerate all prescribed exercises with no adverse effects. Patient continues to benefit from skilled PT services and should be progressed as able to improve functional independence.    OBJECTIVE IMPAIRMENTS: decreased endurance, difficulty walking, decreased ROM, decreased strength, and pain.   ACTIVITY LIMITATIONS: carrying, lifting, sitting, standing, squatting, sleeping, stairs, and bed mobility  PARTICIPATION LIMITATIONS: occupation  PERSONAL FACTORS: 1-2 comorbidities: DM and HTN  are also affecting patient's functional outcome.   REHAB POTENTIAL: Good  CLINICAL DECISION MAKING: Stable/uncomplicated  EVALUATION COMPLEXITY: Low   GOALS: Goals reviewed with patient? No  SHORT TERM GOALS: Target date: 10/22/2023    Patient will be independent with HEP to improve carryover of sessions Baseline: Goal status: MET  2.  Patient will be able to sit on floor greater than 30 minutes without increase in pain to perform work tasks as Runner, broadcasting/film/video Baseline: Unable to sit on floor greater than 5-10 minutes without exacerbation of symptoms Goal status: MET  3.  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  Patient will be able to ascend and descend stairs without pain to navigate home and  school Baseline: Pain with stair negotiation  Goal status: INITIAL  3.  Patient will be able to walk greater than 30 minutes without pain to return to exercise routines and be able to perform errands without restriction Baseline: walking less than 20 minutes before pain Goal status: INITIAL  PLAN:  PT FREQUENCY: 2x/week  PT DURATION: 8 weeks  PLANNED INTERVENTIONS: 97146- PT Re-evaluation, 97110-Therapeutic exercises, 97530- Therapeutic activity, 97112- Neuromuscular re-education, 97535- Self Care, [id number]- Manual therapy, [id number]- Gait training, [phone]- Aquatic Therapy, Patient/Family education, Stair training, Taping, Dry Needling, Joint mobilization, Joint manipulation, Spinal manipulation, Spinal mobilization, and Moist heat.  PLAN FOR NEXT SESSION: Core stability, hip strengthening, stair training   Berta Minor, PTA 10/29/2023, 10:24 AM

## 2023-11-01 ENCOUNTER — Ambulatory Visit: Payer: No Typology Code available for payment source | Admitting: Physical Therapy

## 2023-11-01 ENCOUNTER — Encounter: Payer: Self-pay | Admitting: Physical Therapy

## 2023-11-01 DIAGNOSIS — M5459 Other low back pain: Secondary | ICD-10-CM

## 2023-11-01 DIAGNOSIS — M6281 Muscle weakness (generalized): Secondary | ICD-10-CM

## 2023-11-01 NOTE — Therapy (Signed)
OUTPATIENT PHYSICAL THERAPY TREATMENT NOTE   Patient Name: Cassandra Mayer MRN: [id number] DOB:July 03, 1981, 42 y.o., female Today's Date: 11/01/2023  END OF SESSION:  PT End of Session - 11/01/23 1655     Visit Number 9    Date for PT Re-Evaluation 11/26/23    Authorization Type [employer]    Authorization Time Period VL 60    PT Start Time 1700    PT Stop Time 1741    PT Time Calculation (min) 41 min    Activity Tolerance Patient tolerated treatment well;Patient limited by pain    Behavior During Therapy WFL for tasks assessed/performed             Past Medical History:  Diagnosis Date   Hypertension    Obesity    Past Surgical History:  Procedure Laterality Date   WISDOM TOOTH EXTRACTION     There are no problems to display for this patient.   PCP: None  REFERRING PROVIDER: Rodolph Bong  REFERRING DIAG: Low back pain  Rationale for Evaluation and Treatment: Rehabilitation  THERAPY DIAG:  Other low back pain  Muscle weakness (generalized)  ONSET DATE: Patient reports July of 2024  SUBJECTIVE:                                                                                                                                                                                           SUBJECTIVE STATEMENT: Pt reports she was a bit sore after last visit but has had minimal pain.  PERTINENT HISTORY:  Diabetes, HTN.   PAIN:  Are you having pain? Yes: NPRS scale: 0/10 Pain location: right sided thoracic/lumbar area Pain description: spasms, soreness, deep aching Aggravating factors: Laying on right side, if someone bumps into her, sitting on the floor more than 5-10 minutes at work, stairs, house chores Relieving factors: muscle relaxers, stretching, massage chair, heat  PRECAUTIONS: None  RED FLAGS: None   WEIGHT BEARING RESTRICTIONS: No  FALLS:  Has patient fallen in last 6 months? No  LIVING ENVIRONMENT: Lives with: lives with  their family Lives in: House/apartment Stairs: Yes: Internal: 13 steps; on right going up Has following equipment at home: None  OCCUPATION: Teacher  PLOF: Independent  PATIENT GOALS: Decrease pain, be able to walk and be outside without restriction  NEXT MD VISIT: 10/18  OBJECTIVE:  Note: Objective measures were completed at Evaluation unless otherwise noted.  DIAGNOSTIC FINDINGS:  Patient presents with facet dysfunction and right sided thoracic/lumbar pain  PATIENT SURVEYS:  FOTO administer at next session 09/27/23: 51% predicted 65%  COGNITION: Overall cognitive status: Within functional limits for tasks assessed  SENSATION: WFL    POSTURE:  Sits with rounded shoulders and places UE on table next to her to prop up in sitting  PALPATION: TTP right lumbar paraspinals, thoracic paraspinals, QL. Significant joint hypomobility right T9-L2  LUMBAR ROM:   AROM eval  Flexion Fingers to mid shin with pain   Extension Unable to perform due to pain  Right lateral flexion Fingers to mid thigh with pain  Left lateral flexion Fingers to mid thigh with pain  Right rotation 50% with pain  Left rotation 25% more painful than right   (Blank rows = not tested)   LOWER EXTREMITY MMT:    MMT Right eval Left eval  Hip flexion 3- unable to hold against resistance 3+ with increased right sided pain  Hip extension    Hip abduction    Hip adduction    Hip internal rotation    Hip external rotation    Knee flexion 3/5 4-  Knee extension 3-/5 with increased pain 4 with increased pain  Ankle dorsiflexion    Ankle plantarflexion    Ankle inversion    Ankle eversion     (Blank rows = not tested)  FUNCTIONAL TESTS:  5 times sit to stand: 21.39 seconds  GAIT: Comments: Walks with slow gait speed. Step to gait pattern and demonstrates increased lateral truncal sway due to pain/weakness of LE. Minimal trunk rotation in guarded posture  TODAY'S TREATMENT:          [facility]  Adult PT Treatment:                                                DATE: 11/01/23 Therapeutic Exercise: Nustep level 7 x 5 mins LE X-over - 10x S/L clam - black TB - 2x10 Dead bug with knee ext - 3x10 Bridge with march - 3x10 Bil shoulder ext with band - Black TB - 90-90 Side plank from knees - x10 ea (R is painful) Cable chop - 13# - 2x10 BIL Sit to stand from raised table - 3x10 - 15# Paloff press with rotation - 2x10 - 10# QL 10# lift in standing - 2x10 (1 set on Lt, painful on R side lower back) Standing row - 17# - 2x10              [facility] Adult PT Treatment: Therapeutic Exercise: Nustep level 5 x 5 mins LE X-over - 10x S/L clam - blue TB - 2x10 S/L hip abd Dead bug with knee ext - 2x10 Bridge with march - 3x10 Bil shoulder ext with band - Black TB Cable chop - 10# - 2x10 KB paloff press - 10# - 2x10 With rotation x4 with some pain Sit to stand from raised table - 2x10 - 10# Paloff press - 2x10 - 10# QL 10# lift in standing - 2x10 10# Standing row - 17# - 2x10  PATIENT EDUCATION:  Education details: Discussed objective findings. Discussed POC including frequency, HEP, and anatomy/physiology of present condition Person educated: Patient Education method: Explanation, Demonstration, and Handouts Education comprehension: verbalized understanding, returned demonstration, and needs further education  HOME EXERCISE PROGRAM: Access Code: [email or web address] URL: [email or web address] Date: 09/24/2023 Prepared by: Rinaldo Ratel Diy  Exercises - Seated Hip Adduction Squeeze with Ball  - 1 x daily - 7 x weekly - 2 sets - 10 reps - Seated Thoracic Flexion and Rotation with Swiss  Ball  - 1 x daily - 7 x weekly - 3 sets - 10 reps - Seated March  - 1 x daily - 7 x weekly - 3 sets - 10 reps  ASSESSMENT:  CLINICAL IMPRESSION: Maybree tolerated session well with no adverse reaction.  She does have an increase in pain with R S/L plank and this was d/c; she did have some  residual soreness following this.  Overall she shows significant increase in core and hip exercise volume and intensity compared to even a few sessions ago.  Will evaluate goals next visit and decide if she would like to continue for a few more weeks.     OBJECTIVE IMPAIRMENTS: decreased endurance, difficulty walking, decreased ROM, decreased strength, and pain.   ACTIVITY LIMITATIONS: carrying, lifting, sitting, standing, squatting, sleeping, stairs, and bed mobility  PARTICIPATION LIMITATIONS: occupation  PERSONAL FACTORS: 1-2 comorbidities: DM and HTN  are also affecting patient's functional outcome.   REHAB POTENTIAL: Good  CLINICAL DECISION MAKING: Stable/uncomplicated  EVALUATION COMPLEXITY: Low   GOALS: Goals reviewed with patient? No  SHORT TERM GOALS: Target date: 10/22/2023    Patient will be independent with HEP to improve carryover of sessions Baseline: Goal status: MET  2.  Patient will be able to sit on floor greater than 30 minutes without increase in pain to perform work tasks as Runner, broadcasting/film/video Baseline: Unable to sit on floor greater than 5-10 minutes without exacerbation of symptoms Goal status: MET  3.  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  Patient will be able to ascend and descend stairs without pain to navigate home and school Baseline: Pain with stair negotiation  Goal status: INITIAL  3.  Patient will be able to walk greater than 30 minutes without pain to return to exercise routines and be able to perform errands without  restriction Baseline: walking less than 20 minutes before pain Goal status: INITIAL  PLAN:  PT FREQUENCY: 2x/week  PT DURATION: 8 weeks  PLANNED INTERVENTIONS: 97146- PT Re-evaluation, 97110-Therapeutic exercises, 97530- Therapeutic activity, 97112- Neuromuscular re-education, 97535- Self Care, [id number]- Manual therapy, [id number]- Gait training, [phone]- Aquatic Therapy, Patient/Family education, Stair training, Taping, Dry Needling, Joint mobilization, Joint manipulation, Spinal manipulation, Spinal mobilization, and Moist heat.  PLAN FOR NEXT SESSION: Core stability, hip strengthening, stair training   Fredderick Phenix, PT 11/01/2023, 5:45 PM

## 2023-11-05 ENCOUNTER — Ambulatory Visit: Payer: No Typology Code available for payment source

## 2023-11-05 DIAGNOSIS — M5459 Other low back pain: Secondary | ICD-10-CM | POA: Diagnosis not present

## 2023-11-05 DIAGNOSIS — M6281 Muscle weakness (generalized): Secondary | ICD-10-CM

## 2023-11-05 NOTE — Therapy (Signed)
OUTPATIENT PHYSICAL THERAPY TREATMENT NOTE   Patient Name: Cassandra Mayer MRN: [id number] DOB:September 02, 1981, 42 y.o., female Today's Date: 11/05/2023  END OF SESSION:  PT End of Session - 11/05/23 1030     Visit Number 10    Date for PT Re-Evaluation 11/26/23    Authorization Type [employer]    Authorization Time Period VL 60    PT Start Time 1030    PT Stop Time 1108    PT Time Calculation (min) 38 min    Activity Tolerance Patient tolerated treatment well;Patient limited by pain    Behavior During Therapy WFL for tasks assessed/performed              Past Medical History:  Diagnosis Date   Hypertension    Obesity    Past Surgical History:  Procedure Laterality Date   WISDOM TOOTH EXTRACTION     There are no problems to display for this patient.   PCP: None  REFERRING PROVIDER: Rodolph Bong  REFERRING DIAG: Low back pain  Rationale for Evaluation and Treatment: Rehabilitation  THERAPY DIAG:  Other low back pain  Muscle weakness (generalized)  ONSET DATE: Patient reports July of 2024  SUBJECTIVE:                                                                                                                                                                                           SUBJECTIVE STATEMENT: Patient reports minimal soreness after previous session and no current.   PERTINENT HISTORY:  Diabetes, HTN.   PAIN:  Are you having pain? Yes: NPRS scale: 0/10 Pain location: right sided thoracic/lumbar area Pain description: spasms, soreness, deep aching Aggravating factors: Laying on right side, if someone bumps into her, sitting on the floor more than 5-10 minutes at work, stairs, house chores Relieving factors: muscle relaxers, stretching, massage chair, heat  PRECAUTIONS: None  RED FLAGS: None   WEIGHT BEARING RESTRICTIONS: No  FALLS:  Has patient fallen in last 6 months? No  LIVING ENVIRONMENT: Lives with: lives  with their family Lives in: House/apartment Stairs: Yes: Internal: 13 steps; on right going up Has following equipment at home: None  OCCUPATION: Teacher  PLOF: Independent  PATIENT GOALS: Decrease pain, be able to walk and be outside without restriction  NEXT MD VISIT: 10/18  OBJECTIVE:  Note: Objective measures were completed at Evaluation unless otherwise noted.  DIAGNOSTIC FINDINGS:  Patient presents with facet dysfunction and right sided thoracic/lumbar pain  PATIENT SURVEYS:  FOTO administer at next session 09/27/23: 51% predicted 65%  COGNITION: Overall cognitive status: Within functional limits for tasks assessed  SENSATION: WFL    POSTURE:  Sits with rounded shoulders and places UE on table next to her to prop up in sitting  PALPATION: TTP right lumbar paraspinals, thoracic paraspinals, QL. Significant joint hypomobility right T9-L2  LUMBAR ROM:   AROM eval  Flexion Fingers to mid shin with pain   Extension Unable to perform due to pain  Right lateral flexion Fingers to mid thigh with pain  Left lateral flexion Fingers to mid thigh with pain  Right rotation 50% with pain  Left rotation 25% more painful than right   (Blank rows = not tested)   LOWER EXTREMITY MMT:    MMT Right eval Left eval  Hip flexion 3- unable to hold against resistance 3+ with increased right sided pain  Hip extension    Hip abduction    Hip adduction    Hip internal rotation    Hip external rotation    Knee flexion 3/5 4-  Knee extension 3-/5 with increased pain 4 with increased pain  Ankle dorsiflexion    Ankle plantarflexion    Ankle inversion    Ankle eversion     (Blank rows = not tested)  FUNCTIONAL TESTS:  5 times sit to stand: 21.39 seconds 11/05/23: 10 seconds no UE  GAIT: Comments: Walks with slow gait speed. Step to gait pattern and demonstrates increased lateral truncal sway due to pain/weakness of LE. Minimal trunk rotation in guarded  posture  TODAY'S TREATMENT:    [facility] Adult PT Treatment:                                                DATE: 11/05/23 Therapeutic Exercise: Nustep level 7 x 5 mins Cable chop - 13# - 2x10 BIL Paloff press with rotation - 2x10 - 10# QL 10# lift in standing - 2x10 Standing row - 17# - 2x10 Therapeutic Activity: Review of goals, update of HEP         [facility] Adult PT Treatment:                                                DATE: 11/01/23 Therapeutic Exercise: Nustep level 7 x 5 mins LE X-over - 10x S/L clam - black TB - 2x10 Dead bug with knee ext - 3x10 Bridge with march - 3x10 Bil shoulder ext with band - Black TB - 90-90 Side plank from knees - x10 ea (R is painful) Cable chop - 13# - 2x10 BIL Sit to stand from raised table - 3x10 - 15# Paloff press with rotation - 2x10 - 10# QL 10# lift in standing - 2x10 (1 set on Lt, painful on R side lower back) Standing row - 17# - 2x10              PATIENT EDUCATION:  Education details: Discussed objective findings. Discussed POC including frequency, HEP, and anatomy/physiology of present condition Person educated: Patient Education method: Explanation, Demonstration, and Handouts Education comprehension: verbalized understanding, returned demonstration, and needs further education  HOME EXERCISE PROGRAM: Access Code: [email or web address] URL: [email or web address] Date: 09/24/2023 Prepared by: Rinaldo Ratel Diy  Exercises - Seated Hip Adduction Squeeze with Ball  - 1 x daily - 7 x weekly - 2 sets - 10 reps -  Seated Thoracic Flexion and Rotation with Swiss Ball  - 1 x daily - 7 x weekly - 3 sets - 10 reps - Seated March  - 1 x daily - 7 x weekly - 3 sets - 10 reps  ASSESSMENT:  CLINICAL IMPRESSION: Patient presents to PT reporting no current pain and that she would like to try pausing PT for now. Session today focused on continued strengthening of core, particularly with rotational movements. Updated and reviewed HEP and  provided resistance bands. She has met all of her goals and is appropriate for DC from PT at this time.     OBJECTIVE IMPAIRMENTS: decreased endurance, difficulty walking, decreased ROM, decreased strength, and pain.   ACTIVITY LIMITATIONS: carrying, lifting, sitting, standing, squatting, sleeping, stairs, and bed mobility  PARTICIPATION LIMITATIONS: occupation  PERSONAL FACTORS: 1-2 comorbidities: DM and HTN  are also affecting patient's functional outcome.   REHAB POTENTIAL: Good  CLINICAL DECISION MAKING: Stable/uncomplicated  EVALUATION COMPLEXITY: Low   GOALS: Goals reviewed with patient? No  SHORT TERM GOALS: Target date: 10/22/2023    Patient will be independent with HEP to improve carryover of sessions Baseline: Goal status: MET  2.  Patient will be able to sit on floor greater than 30 minutes without increase in pain to perform work tasks as Runner, broadcasting/film/video Baseline: Unable to sit on floor greater than 5-10 minutes without exacerbation of symptoms Goal status: MET  3.  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  Patient will be able to ascend and descend stairs without pain to navigate home and school Baseline: Pain with stair negotiation  Goal status: MET 11/05/23: Pt reports able to navigate without pain   3.  Patient will be able to walk greater than 30 minutes without pain to return to exercise routines and be able to perform errands without restriction Baseline: walking  less than 20 minutes before pain Goal status: MET 11/05/23: Pt reports 45-60 minutes  PLAN:  PT FREQUENCY: 2x/week  PT DURATION: 8 weeks  PLANNED INTERVENTIONS: 97146- PT Re-evaluation, 97110-Therapeutic exercises, 97530- Therapeutic activity, 97112- Neuromuscular re-education, 97535- Self Care, 72536- Manual therapy, [phone]- Gait training, [phone]- Aquatic Therapy, Patient/Family education, Stair training, Taping, Dry Needling, Joint mobilization, Joint manipulation, Spinal manipulation, Spinal mobilization, and Moist heat.  PLAN FOR NEXT SESSION: DC   Berta Minor, PTA 11/05/2023, 11:09 AM

## 2024-02-17 ENCOUNTER — Telehealth: Admitting: Physician Assistant

## 2024-02-17 DIAGNOSIS — L0292 Furuncle, unspecified: Secondary | ICD-10-CM

## 2024-02-17 MED ORDER — CEPHALEXIN 500 MG PO CAPS: 500.0000 mg | ORAL_CAPSULE | Freq: Four times a day (QID) | ORAL | 0 refills | Status: DC

## 2024-02-17 NOTE — Progress Notes (Signed)
 E-Visit for Cellulitis/Superficial Skin Infection/Abscess   We are sorry that you are not feeling well. Here is how we plan to help!  Based on what you shared with me it looks like you have cellulitis.  Cellulitis looks like areas of skin redness, swelling, and warmth; it develops as a result of bacteria entering under the skin. Little red spots and/or bleeding can be seen in skin, and tiny surface sacs containing fluid can occur. Fever can be present. Cellulitis is almost always on one side of a body, and the lower limbs are the most common site of involvement.   I have prescribed:  Keflex 500mg  take one by mouth four times a day for 5 days  HOME CARE:  Take your medications as ordered and take all of them, even if the skin irritation appears to be healing.   GET HELP RIGHT AWAY IF:  Symptoms that don't begin to go away within 48 hours. Severe redness persists or worsens If the area turns color, spreads or swells. If it blisters and opens, develops yellow-brown crust or bleeds. You develop a fever or chills. If the pain increases or becomes unbearable.  Are unable to keep fluids and food down.  MAKE SURE YOU   Understand these instructions. Will watch your condition. Will get help right away if you are not doing well or get worse.  Thank you for choosing an e-visit.  Your e-visit answers were reviewed by a board certified advanced clinical practitioner to complete your personal care plan. Depending upon the condition, your plan could have included both over the counter or prescription medications.  Please review your pharmacy choice. Make sure the pharmacy is open so you can pick up prescription now. If there is a problem, you may contact your provider through [employer] and have the prescription routed to another pharmacy.  Your safety is important to [place]. If you have drug allergies check your prescription carefully.   For the next 24 hours you can use MyChart to ask  questions about today's visit, request a non-urgent call back, or ask for a work or school excuse. You will get an email in the next two days asking about your experience. I hope that your e-visit has been valuable and will speed your recovery.    I have spent 5 minutes in review of e-visit questionnaire, review and updating patient chart, medical decision making and response to patient.   Margaretann Loveless, PA-C

## 2024-03-05 ENCOUNTER — Telehealth: Admitting: Physician Assistant

## 2024-03-05 DIAGNOSIS — B354 Tinea corporis: Secondary | ICD-10-CM | POA: Diagnosis not present

## 2024-03-05 MED ORDER — CLOTRIMAZOLE-BETAMETHASONE 1-0.05 % EX CREA: 1.0000 | TOPICAL_CREAM | Freq: Two times a day (BID) | CUTANEOUS | 0 refills | Status: AC

## 2024-03-05 NOTE — Progress Notes (Signed)
 I have spent 5 minutes in review of e-visit questionnaire, review and updating patient chart, medical decision making and response to patient.   Piedad Climes, PA-C

## 2024-03-05 NOTE — Progress Notes (Signed)
 E Visit for Rash  We are sorry that you are not feeling well. Here is how we plan to help!  Based upon your presentation it appears you have a fungal infection.  I have prescribed: topical Lotrisone cream to apply to the area as directed for 14 days.    HOME CARE:  Take cool showers and avoid direct sunlight. Apply cool compress or wet dressings. Take a bath in an oatmeal bath.  Sprinkle content of one Aveeno packet under running faucet with comfortably warm water.  Bathe for 15-20 minutes, 1-2 times daily.  Pat dry with a towel. Do not rub the rash. Use hydrocortisone cream. Take an antihistamine like Benadryl for widespread rashes that itch.  The adult dose of Benadryl is 25-50 mg by mouth 4 times daily. Caution:  This type of medication may cause sleepiness.  Do not drink alcohol, drive, or operate dangerous machinery while taking antihistamines.  Do not take these medications if you have prostate enlargement.  Read package instructions thoroughly on all medications that you take.  GET HELP RIGHT AWAY IF:  Symptoms don't go away after treatment. Severe itching that persists. If you rash spreads or swells. If you rash begins to smell. If it blisters and opens or develops a yellow-brown crust. You develop a fever. You have a sore throat. You become short of breath.  MAKE SURE YOU:  Understand these instructions. Will watch your condition. Will get help right away if you are not doing well or get worse.  Thank you for choosing an e-visit.  Your e-visit answers were reviewed by a board certified advanced clinical practitioner to complete your personal care plan. Depending upon the condition, your plan could have included both over the counter or prescription medications.  Please review your pharmacy choice. Make sure the pharmacy is open so you can pick up prescription now. If there is a problem, you may contact your provider through [employer] and have the prescription  routed to another pharmacy.  Your safety is important to [place]. If you have drug allergies check your prescription carefully.   For the next 24 hours you can use MyChart to ask questions about today's visit, request a non-urgent call back, or ask for a work or school excuse. You will get an email in the next two days asking about your experience. I hope that your e-visit has been valuable and will speed your recovery.

## 2024-06-16 ENCOUNTER — Encounter (HOSPITAL_COMMUNITY): Payer: Self-pay

## 2024-06-16 ENCOUNTER — Telehealth: Admitting: Nurse Practitioner

## 2024-06-16 ENCOUNTER — Emergency Department (HOSPITAL_COMMUNITY)
Admission: EM | Admit: 2024-06-16 | Discharge: 2024-06-16 | Disposition: A | Discharge status: Home / Self Care | Attending: Emergency Medicine | Admitting: Emergency Medicine

## 2024-06-16 ENCOUNTER — Other Ambulatory Visit: Payer: Self-pay

## 2024-06-16 DIAGNOSIS — Z79899 Other long term (current) drug therapy: Secondary | ICD-10-CM | POA: Insufficient documentation

## 2024-06-16 DIAGNOSIS — I10 Essential (primary) hypertension: Secondary | ICD-10-CM | POA: Insufficient documentation

## 2024-06-16 DIAGNOSIS — R2240 Localized swelling, mass and lump, unspecified lower limb: Secondary | ICD-10-CM

## 2024-06-16 DIAGNOSIS — L03115 Cellulitis of right lower limb: Secondary | ICD-10-CM | POA: Insufficient documentation

## 2024-06-16 DIAGNOSIS — R21 Rash and other nonspecific skin eruption: Secondary | ICD-10-CM | POA: Diagnosis present

## 2024-06-16 LAB — CBC WITH DIFFERENTIAL/PLATELET
Abs Immature Granulocytes: 0.01 K/uL (ref 0.00–0.07)
Basophils Absolute: 0 K/uL (ref 0.0–0.1)
Basophils Relative: 0 %
Eosinophils Absolute: 0.2 K/uL (ref 0.0–0.5)
Eosinophils Relative: 2 %
HCT: 39.7 % (ref 36.0–46.0)
Hemoglobin: 12.1 g/dL (ref 12.0–15.0)
Immature Granulocytes: 0 %
Lymphocytes Relative: 36 %
Lymphs Abs: 2.5 K/uL (ref 0.7–4.0)
MCH: 26.2 pg (ref 26.0–34.0)
MCHC: 30.5 g/dL (ref 30.0–36.0)
MCV: 85.9 fL (ref 80.0–100.0)
Monocytes Absolute: 0.7 K/uL (ref 0.1–1.0)
Monocytes Relative: 10 %
Neutro Abs: 3.6 K/uL (ref 1.7–7.7)
Neutrophils Relative %: 52 %
Platelets: 352 K/uL (ref 150–400)
RBC: 4.62 MIL/uL (ref 3.87–5.11)
RDW: 14.3 % (ref 11.5–15.5)
WBC: 6.9 K/uL (ref 4.0–10.5)
nRBC: 0 % (ref 0.0–0.2)

## 2024-06-16 LAB — BASIC METABOLIC PANEL WITH GFR
Anion gap: 9 (ref 5–15)
BUN: 12 mg/dL (ref 6–20)
CO2: 27 mmol/L (ref 22–32)
Calcium: 9.6 mg/dL (ref 8.9–10.3)
Chloride: 104 mmol/L (ref 98–111)
Creatinine, Ser: 0.99 mg/dL (ref 0.44–1.00)
GFR, Estimated: >60 mL/min (ref >60)
Glucose, Bld: 98 mg/dL (ref 70–99)
Potassium: 4.3 mmol/L (ref 3.5–5.1)
Sodium: 140 mmol/L (ref 135–145)

## 2024-06-16 MED ORDER — HYDROCORTISONE 2.5 % EX CREA: TOPICAL_CREAM | Freq: Two times a day (BID) | CUTANEOUS | 0 refills | Status: DC

## 2024-06-16 MED ORDER — CLOBETASOL PROPIONATE 0.05 % EX LOTN: 1.0000 | TOPICAL_LOTION | Freq: Two times a day (BID) | CUTANEOUS | 0 refills | Status: DC

## 2024-06-16 MED ORDER — CEPHALEXIN 500 MG PO CAPS: 500.0000 mg | ORAL_CAPSULE | Freq: Four times a day (QID) | ORAL | 0 refills | Status: DC

## 2024-06-16 NOTE — Progress Notes (Signed)
 E-Visit for Insect Sting  Thank you for describing the insect sting for us .  Here is how we plan to help!  An uncomplicated insect sting that just occurred and can be closely followed using the instructions in your care plan. I have prescribed a prescription strength hydrocortisone  to apply twice daily  The 2 greatest risks from insect stings are allergic reaction, which can be fatal in some people and infection, which is more common and less serious.  Bees, wasps, yellow jackets, and hornets belong to a class of insects called Hymenoptera.  Most insect stings cause only minor discomfort.  Stings can happen anywhere on the body and can be painful.  Most stings are from honey bees or yellow jackets.  Fire ants can sting multiple times.  The sites of the stings are more likely to become infected.    Based on your information I have: and Provided a home care guide for insect stings and instructions on when to call for help.  What can be used to prevent Insect Stings?  Insect repellant with at least 20% DEET.  Wearing long pants and shirts with socks and shoes.  Wear dark or drab-colored clothes rather than bright colors.  Avoid using perfumes and hair sprays; these attract insects.  HOME CARE ADVICE:  1. Stinger removal: The stinger looks like a tiny black dot in the sting. Use a fingernail, credit card edge, or knife-edge to scrape it off.  Don't pull it out because it squeezes out more venom. If the stinger is below the skin surface, leave it alone.  It will be shed with normal skin healing. 2. Use cold compresses to the area of the sting for 10-20 minutes.  You may repeat this as needed to relieve symptoms of pain and swelling. 3.  For pain relief, take acetominophen 650 mg 4-6 hours as needed or ibuprofen  400 mg every 6-8 hours as needed or naproxen 250-500 mg every 12 hours as needed. 4.  You can also use hydrocortisone  cream 0.5% or 1% up to 4 times daily as needed for itching. 5.   If the sting becomes very itchy, take Benadryl  25-50 mg, follow directions on box. 6.  Wash the area 2-3 times daily with antibacterial soap and warm water. 7. Call your Doctor if: Fever, a severe headache, or rash occur in the next 2 weeks. Sting area begins to look infected. Redness and swelling worsens after home treatment. Your current symptoms become worse.    MAKE SURE YOU:  Understand these instructions. Will watch your condition. Will get help right away if you are not doing well or get worse.  Thank you for choosing an e-visit.  Your e-visit answers were reviewed by a board certified advanced clinical practitioner to complete your personal care plan. Depending upon the condition, your plan could have included both over the counter or prescription medications.  Please review your pharmacy choice. Make sure the pharmacy is open so you can pick up prescription now. If there is a problem, you may contact your provider through [employer] and have the prescription routed to another pharmacy.  Your safety is important to us . If you have drug allergies check your prescription carefully.   For the next 24 hours you can use MyChart to ask questions about today's visit, request a non-urgent call back, or ask for a work or school excuse. You will get an email in the next two days asking about your experience. I hope that your e-visit has been  valuable and will speed your recovery.

## 2024-06-16 NOTE — Discharge Instructions (Addendum)
 You were seen today for what appears to be cellulitis that could be caused from itching post bug bite or skin irritation from working outside yesterday.  I am prescribing an antibiotic which you are to take for the next 5 days, 4 times daily.  Please take with food to help avoid stomach irritation.  I will also prescribing you a another topical steroid which you can use for the itchiness that you are experiencing.  Please return to the ED if you been to have experiencing any fever, uncontrolled pain, increased welling despite medications, numbness, weakness, tingling.  For persistent symptoms, go to PCP for reevaluation.

## 2024-06-16 NOTE — ED Provider Notes (Signed)
 [place] EMERGENCY DEPARTMENT AT [facility] Provider Note   CSN: [id number] Arrival date & time: 06/16/24  1549     Patient presents with: Rash   PAHOLA Mayer is a 43 y.o. female.  Rash Associated symptoms: myalgias    Patient is a 43 year old female to the ED today with concerns for right lower leg swelling, pruritic, noting that she has had enlarging annular area of swelling, itching, pain to palpation after notably working in the yard yesterday.  Had an ED visit at that time was prescribed hydrocortisone  cream which has been not effective.  Has not been using other medications at this time.  Patient is a suspecting a bug bite. No history of DVTs, not on birth control.  Notably is also concerned for blood pressure, noting that she has had headache and elevated blood pressure today despite taking his other blood pressure medications.  Denies fever, blurry vision, vertigo, unilateral weakness, numbness, weakness, tingling, chest pain, shortness of breath, abdominal pain, nausea, vomiting, diarrhea, dysuria, hematuria.    Prior to Admission medications   Medication Sig Start Date End Date Taking? Authorizing Provider  cephALEXin  (KEFLEX ) 500 MG capsule Take 1 capsule (500 mg total) by mouth 4 (four) times daily. 06/16/24  Yes Cassandra Mayer S, Cassandra Mayer-C  Clobetasol  Propionate 0.05 % lotion Apply 1 Application topically 2 (two) times daily. 06/16/24  Yes Cassandra Mayer S, Cassandra Mayer-C  amLODipine  (NORVASC ) 10 MG tablet Take 10 mg by mouth daily.    [provider]  hydrocortisone  2.5 % cream Apply topically 2 (two) times daily. 06/16/24   Fleming, Zelda W, NP  valsartan  (DIOVAN ) 320 MG tablet Take 320 mg by mouth daily.    [provider]    Allergies: Banana, Bee venom, Bee venom, Shellfish allergy, Shellfish-derived products, and Tape    Review of Systems  Musculoskeletal:  Positive for myalgias.  Skin:  Positive for rash.  All other systems reviewed and are  negative.   Updated Vital Signs BP 131/77   Pulse 81   Temp 98.3 F (36.8 C) (Oral)   Resp 18   Ht 5' 4 (1.626 m)   Wt (!) 152 kg   SpO2 99%   BMI 57.50 kg/m   Physical Exam Vitals and nursing note reviewed.  Constitutional:      General: She is not in acute distress.    Appearance: Normal appearance. She is not ill-appearing or diaphoretic.  HENT:     Head: Normocephalic and atraumatic.  Eyes:     General: No scleral icterus.       Right eye: No discharge.        Left eye: No discharge.     Extraocular Movements: Extraocular movements intact.     Conjunctiva/sclera: Conjunctivae normal.  Cardiovascular:     Rate and Rhythm: Normal rate and regular rhythm.     Pulses: Normal pulses.     Heart sounds: Normal heart sounds. No murmur heard.    No friction rub. No gallop.  Pulmonary:     Effort: Pulmonary effort is normal. No respiratory distress.     Breath sounds: Normal breath sounds. No stridor. No wheezing, rhonchi or rales.  Chest:     Chest wall: No tenderness.  Abdominal:     General: Abdomen is flat. There is no distension.     Palpations: Abdomen is soft.     Tenderness: There is no abdominal tenderness. There is no right CVA tenderness, left CVA tenderness or guarding.  Musculoskeletal:  General: Swelling (Notable area of swelling on medial SPECT of right calf.  No edema noted around ankle) present. Normal range of motion.     Cervical back: Normal range of motion. No rigidity or tenderness.     Left lower leg: No edema.  Skin:    General: Skin is warm and dry.     Findings: Erythema and rash present.  Neurological:     General: No focal deficit present.     Mental Status: She is alert and oriented to person, place, and time. Mental status is at baseline.     Sensory: No sensory deficit.     Motor: No weakness.     Gait: Gait normal.  Psychiatric:        Mood and Affect: Mood normal.     (all labs ordered are listed, but only abnormal results  are displayed) Labs Reviewed  CBC WITH DIFFERENTIAL/PLATELET  BASIC METABOLIC PANEL WITH GFR    EKG: None  Radiology: No results found.   Procedures   Medications Ordered in the ED - No data to display                              Medical Decision Making  This patient is a 43 year old female who presents to the ED for concern of rash to her right lower extremity that is annular, swollen, warm to the touch, erythematous and pruritic, happened after working outside yesterday.  Notably has an elevated BP upon arrival despite taking BP medications today, only complaining of mild headache but otherwise no symptoms.  On physical exam, patient is in no acute distress, afebrile, alert and orient x 4, speaking in full sentences, nontachypneic, nontachycardic.  Notably has a annular 8 cm x 8 cm area of edema, point tenderness, erythema, warmth to the touch, to right calf.  No edema noted to around ankle bilaterally.  DP pulse 2+ bilaterally, normal sensation bilaterally, normal strength and coordination bilaterally.  Unremarkable exam otherwise.  Suspect likely cellulitis after contact dermatitis with diffuse erythema over the area with no discernible borders.  Low suspicion for DVT, abscess, gangrene.  Will prescribe steroid for the pruritus as well as antibiotic for the likely cellulitis.  Will also have her follow-up with PCP for reevaluation.  Labs ordered secondary to elevated BP, and were unremarkable.  Low suspicion for hypertensive emergency at this time.  Blood pressure normalized.  Will have her follow-up with PCP for continued evaluation.  Patient vital signs have remained stable throughout the course of patient's time in the ED. Low suspicion for any other emergent pathology at this time. I believe this patient is safe to be discharged. Provided strict return to ER precautions. Patient expressed agreement and understanding of plan. All questions were answered.  Differential diagnoses  prior to evaluation: The emergent differential diagnosis includes, but is not limited to, cellulitis, contact dermatitis, abscess, erysipelas, insect bite hypersensitivity, erythema migrans, dictated fasciitis, DVT, gangrene, hypertensive urgency. This is not an exhaustive differential.   Past Medical History / Co-morbidities / Social History: HTN, obesity  Additional history: Chart reviewed. Pertinent results include:  Last seen by PCP on 05/31/2024.  Lab Tests/Imaging studies: I personally interpreted labs/imaging and the pertinent results include:   CBC unremarkable BMP unremarkable   Medications: I ordered medication including Keflex , clobestasol propionate I have reviewed the patients home medicines and have made adjustments as needed.  Critical Interventions: None  Social Determinants of Health: Notably  has good follow-up with PCP  Disposition: After consideration of the diagnostic results and the patients response to treatment, I feel that the patient would benefit from discharge and treatment as above.   emergency department workup does not suggest an emergent condition requiring admission or immediate intervention beyond what has been performed at this time. The plan is: Follow-up with PCP for any persistence, Keflex  and lotion for cellulitis and pruritus, return to ED for any new or worsening symptoms. The patient is safe for discharge and has been instructed to return immediately for worsening symptoms, change in symptoms or any other concerns.   Final diagnoses:  Cellulitis of right lower extremity    ED Discharge Orders          Ordered    cephALEXin  (KEFLEX ) 500 MG capsule  4 times daily        06/16/24 1740    Clobetasol  Propionate 0.05 % lotion  2 times daily        06/16/24 [address] Cassandra Mayer-C 06/16/24 1743    Charlyn Sora, MD 06/16/24 1907

## 2024-06-16 NOTE — Progress Notes (Signed)
 I have spent 5 minutes in review of e-visit questionnaire, review and updating patient chart, medical decision making and response to patient.   Claiborne Rigg, NP

## 2024-06-16 NOTE — ED Triage Notes (Signed)
 Pt reports swollen area on R calf since last night. Pt states that she noticed this after working outside in the yard yesterday. Pt states that rash is itchy and painful

## 2024-10-04 ENCOUNTER — Encounter

## 2024-10-04 ENCOUNTER — Telehealth: Admitting: Family Medicine

## 2024-10-04 DIAGNOSIS — H9201 Otalgia, right ear: Secondary | ICD-10-CM

## 2024-10-04 MED ORDER — CIPRO HC 0.2-1 % OT SUSP: 3.0000 [drp] | Freq: Two times a day (BID) | OTIC | 0 refills | Status: AC

## 2024-10-04 NOTE — Progress Notes (Signed)
   We are sorry that you are not feeling well. Here is how we plan to help! Based on what you have shared with me I am concerned about an infection of the ear canal.   I have prescribed: Ciprofloxin 0.2% and hydrocortisone  1% otic suspension 3 drops in affected ears twice daily for 7 days In certain cases this may progress to a more serious bacterial infection of the middle or inner ear.  If you have a fever 102 and up and significantly worsening symptoms, this could indicate a more serious infection moving to the middle/inner and needs face to face evaluation in an office by a provider. Your symptoms should improve over the next 3 days and should resolve in about 7 days.  HOME CARE:  Wash your hands frequently. Do not place the tip of the bottle on your ear or touch it with your fingers. You can take Acetominophen 650 mg every 4-6 hours as needed for pain.  If pain is severe or moderate, you can apply a heating pad (set on low) or hot water bottle (wrapped in a towel) to outer ear for 20 minutes.  This will also increase drainage. Avoid ear plugs Do not use Q-tips After showers, help the water run out by tilting your head to one side.  GET HELP RIGHT AWAY IF:  Fever is over 102.2 degrees. You develop progressive ear pain or hearing loss. Ear symptoms persist longer than 3 days after treatment.  MAKE SURE YOU:  Understand these instructions. Will watch your condition. Will get help right away if you are not doing well or get worse.  TO PREVENT SWIMMER'S EAR: Use a bathing cap or custom fitted swim molds to keep your ears dry. Towel off after swimming to dry your ears. Tilt your head or pull your earlobes to allow the water to escape your ear canal. If there is still water in your ears, consider using a hairdryer on the lowest setting.  Thank you for choosing an e-visit. Your e-visit answers were reviewed by a board certified advanced clinical practitioner to complete your personal  care plan. Depending upon the condition, your plan could have included both over the counter or prescription medications. Please review your pharmacy choice. Be sure that the pharmacy you have chosen is open so that you can pick up your prescription now.  If there is a problem you may message your provider in MyChart to have the prescription routed to another pharmacy. Your safety is important to us . If you have drug allergies check your prescription carefully.  For the next 24 hours, you can use MyChart to ask questions about today's visit, request a non-urgent call back, or ask for a work or school excuse from your e-visit provider. You will get an email in the next two days asking about your experience. I hope that your e-visit has been valuable and will speed your recovery.   I have spent 5 minutes in review of e-visit questionnaire, review and updating patient chart, medical decision making and response to patient.   Elsie Velma Lunger, PA-C

## 2025-02-01 ENCOUNTER — Ambulatory Visit: Payer: Self-pay | Admitting: Family Medicine
